# Patient Record
Sex: Female | Born: 1960 | Race: White | Hispanic: No | Smoking: Current every day smoker
Health system: Southern US, Community
[De-identification: ages and names within clinical notes are randomized; demographics above are authoritative.]

## PROBLEM LIST (undated history)

## (undated) DIAGNOSIS — F1020 Alcohol dependence, uncomplicated: Secondary | ICD-10-CM

## (undated) DIAGNOSIS — F32A Depression, unspecified: Secondary | ICD-10-CM

## (undated) DIAGNOSIS — F329 Major depressive disorder, single episode, unspecified: Secondary | ICD-10-CM

## (undated) DIAGNOSIS — F419 Anxiety disorder, unspecified: Secondary | ICD-10-CM

## (undated) DIAGNOSIS — E785 Hyperlipidemia, unspecified: Secondary | ICD-10-CM

## (undated) DIAGNOSIS — I10 Essential (primary) hypertension: Secondary | ICD-10-CM

## (undated) DIAGNOSIS — IMO0001 Reserved for inherently not codable concepts without codable children: Secondary | ICD-10-CM

## (undated) HISTORY — DX: Major depressive disorder, single episode, unspecified: F32.9

## (undated) HISTORY — DX: Depression, unspecified: F32.A

## (undated) HISTORY — DX: Reserved for inherently not codable concepts without codable children: IMO0001

## (undated) HISTORY — PX: HEMORRHOID SURGERY: SHX153

## (undated) HISTORY — DX: Essential (primary) hypertension: I10

## (undated) HISTORY — PX: APPENDECTOMY: SHX54

## (undated) HISTORY — DX: Hemochromatosis, unspecified: E83.119

## (undated) HISTORY — DX: Anxiety disorder, unspecified: F41.9

## (undated) HISTORY — DX: Alcohol dependence, uncomplicated: F10.20

---

## 1999-01-29 ENCOUNTER — Encounter: Payer: Self-pay | Admitting: Emergency Medicine

## 1999-01-29 ENCOUNTER — Inpatient Hospital Stay (HOSPITAL_COMMUNITY): Admission: EM | Admit: 1999-01-29 | Discharge: 1999-02-03 | Payer: Self-pay | Admitting: *Deleted

## 2000-09-24 ENCOUNTER — Encounter: Admission: RE | Admit: 2000-09-24 | Discharge: 2000-09-24 | Payer: Self-pay | Admitting: Obstetrics and Gynecology

## 2000-09-24 ENCOUNTER — Encounter: Payer: Self-pay | Admitting: Obstetrics and Gynecology

## 2002-07-30 ENCOUNTER — Other Ambulatory Visit: Admission: RE | Admit: 2002-07-30 | Discharge: 2002-07-30 | Payer: Self-pay | Admitting: Internal Medicine

## 2002-09-24 ENCOUNTER — Encounter: Payer: Self-pay | Admitting: Obstetrics and Gynecology

## 2002-09-24 ENCOUNTER — Encounter: Admission: RE | Admit: 2002-09-24 | Discharge: 2002-09-24 | Payer: Self-pay | Admitting: Obstetrics and Gynecology

## 2003-02-08 ENCOUNTER — Encounter (INDEPENDENT_AMBULATORY_CARE_PROVIDER_SITE_OTHER): Payer: Self-pay | Admitting: *Deleted

## 2003-02-08 ENCOUNTER — Ambulatory Visit (HOSPITAL_COMMUNITY): Admission: RE | Admit: 2003-02-08 | Discharge: 2003-02-08 | Payer: Self-pay | Admitting: Gastroenterology

## 2003-02-16 ENCOUNTER — Encounter (INDEPENDENT_AMBULATORY_CARE_PROVIDER_SITE_OTHER): Payer: Self-pay

## 2003-02-16 ENCOUNTER — Ambulatory Visit (HOSPITAL_COMMUNITY): Admission: RE | Admit: 2003-02-16 | Discharge: 2003-02-16 | Payer: Self-pay | Admitting: Surgery

## 2003-08-06 ENCOUNTER — Other Ambulatory Visit: Admission: RE | Admit: 2003-08-06 | Discharge: 2003-08-06 | Payer: Self-pay | Admitting: Obstetrics and Gynecology

## 2003-10-06 ENCOUNTER — Encounter: Admission: RE | Admit: 2003-10-06 | Discharge: 2003-10-06 | Payer: Self-pay | Admitting: Obstetrics and Gynecology

## 2003-12-02 ENCOUNTER — Encounter (INDEPENDENT_AMBULATORY_CARE_PROVIDER_SITE_OTHER): Payer: Self-pay | Admitting: Specialist

## 2003-12-02 ENCOUNTER — Ambulatory Visit (HOSPITAL_COMMUNITY): Admission: RE | Admit: 2003-12-02 | Discharge: 2003-12-02 | Payer: Self-pay | Admitting: Surgery

## 2003-12-02 ENCOUNTER — Ambulatory Visit (HOSPITAL_BASED_OUTPATIENT_CLINIC_OR_DEPARTMENT_OTHER): Admission: RE | Admit: 2003-12-02 | Discharge: 2003-12-02 | Payer: Self-pay | Admitting: Surgery

## 2004-08-08 ENCOUNTER — Other Ambulatory Visit: Admission: RE | Admit: 2004-08-08 | Discharge: 2004-08-08 | Payer: Self-pay | Admitting: Obstetrics and Gynecology

## 2004-10-11 ENCOUNTER — Ambulatory Visit (HOSPITAL_COMMUNITY): Admission: RE | Admit: 2004-10-11 | Discharge: 2004-10-11 | Payer: Self-pay | Admitting: Obstetrics and Gynecology

## 2005-03-09 ENCOUNTER — Other Ambulatory Visit: Admission: RE | Admit: 2005-03-09 | Discharge: 2005-03-09 | Payer: Self-pay | Admitting: Obstetrics and Gynecology

## 2005-08-13 ENCOUNTER — Other Ambulatory Visit: Admission: RE | Admit: 2005-08-13 | Discharge: 2005-08-13 | Payer: Self-pay | Admitting: Obstetrics and Gynecology

## 2005-10-15 ENCOUNTER — Ambulatory Visit (HOSPITAL_COMMUNITY): Admission: RE | Admit: 2005-10-15 | Discharge: 2005-10-15 | Payer: Self-pay | Admitting: Obstetrics and Gynecology

## 2006-10-17 ENCOUNTER — Ambulatory Visit (HOSPITAL_COMMUNITY): Admission: RE | Admit: 2006-10-17 | Discharge: 2006-10-17 | Payer: Self-pay | Admitting: Obstetrics and Gynecology

## 2007-06-03 ENCOUNTER — Encounter: Admission: RE | Admit: 2007-06-03 | Discharge: 2007-06-03 | Payer: Self-pay | Admitting: Obstetrics and Gynecology

## 2007-07-29 ENCOUNTER — Encounter: Admission: RE | Admit: 2007-07-29 | Discharge: 2007-07-29 | Payer: Self-pay | Admitting: Obstetrics and Gynecology

## 2008-10-01 ENCOUNTER — Ambulatory Visit (HOSPITAL_COMMUNITY): Admission: RE | Admit: 2008-10-01 | Discharge: 2008-10-01 | Payer: Self-pay | Admitting: Obstetrics and Gynecology

## 2009-01-17 LAB — HM COLONOSCOPY: HM Colonoscopy: NORMAL

## 2009-10-03 ENCOUNTER — Ambulatory Visit (HOSPITAL_COMMUNITY): Admission: RE | Admit: 2009-10-03 | Discharge: 2009-10-03 | Payer: Self-pay | Admitting: Obstetrics and Gynecology

## 2010-01-28 ENCOUNTER — Observation Stay (HOSPITAL_COMMUNITY): Admission: EM | Admit: 2010-01-28 | Discharge: 2010-01-31 | Payer: Self-pay | Admitting: Emergency Medicine

## 2010-04-14 ENCOUNTER — Ambulatory Visit: Payer: Self-pay | Admitting: Oncology

## 2010-04-20 LAB — CBC WITH DIFFERENTIAL/PLATELET
BASO%: 0.3 % (ref 0.0–2.0)
HGB: 11.1 g/dL — ABNORMAL LOW (ref 11.6–15.9)
LYMPH%: 21.2 % (ref 14.0–49.7)
MCH: 35.9 pg — ABNORMAL HIGH (ref 25.1–34.0)
MCHC: 35.5 g/dL (ref 31.5–36.0)
MONO%: 7.7 % (ref 0.0–14.0)
NEUT#: 4.1 10*3/uL (ref 1.5–6.5)
Platelets: 185 10*3/uL (ref 145–400)
WBC: 5.9 10*3/uL (ref 3.9–10.3)

## 2010-04-20 LAB — MORPHOLOGY: PLT EST: ADEQUATE

## 2010-04-20 LAB — COMPREHENSIVE METABOLIC PANEL
ALT: 25 U/L (ref 0–35)
AST: 29 U/L (ref 0–37)
Albumin: 4.7 g/dL (ref 3.5–5.2)
Alkaline Phosphatase: 60 U/L (ref 39–117)
Calcium: 9.7 mg/dL (ref 8.4–10.5)
Creatinine, Ser: 1.16 mg/dL (ref 0.40–1.20)
Sodium: 141 mEq/L (ref 135–145)
Total Bilirubin: 0.7 mg/dL (ref 0.3–1.2)
Total Protein: 7.2 g/dL (ref 6.0–8.3)

## 2010-04-23 LAB — IRON AND TIBC
TIBC: 334 ug/dL (ref 250–470)
UIBC: 164 ug/dL

## 2010-04-23 LAB — HEMOCHROMATOSIS DNA-PCR(C282Y,H63D)

## 2010-05-02 ENCOUNTER — Ambulatory Visit (HOSPITAL_COMMUNITY): Admission: RE | Admit: 2010-05-02 | Discharge: 2010-05-02 | Payer: Self-pay | Admitting: Oncology

## 2010-05-08 LAB — CBC WITH DIFFERENTIAL/PLATELET
BASO%: 0.4 % (ref 0.0–2.0)
EOS%: 1.2 % (ref 0.0–7.0)
Eosinophils Absolute: 0.1 10*3/uL (ref 0.0–0.5)
MCH: 35.8 pg — ABNORMAL HIGH (ref 25.1–34.0)
MCHC: 35.1 g/dL (ref 31.5–36.0)
MCV: 102 fL — ABNORMAL HIGH (ref 79.5–101.0)
MONO%: 6.1 % (ref 0.0–14.0)
Platelets: 234 10*3/uL (ref 145–400)
RBC: 3.12 10*6/uL — ABNORMAL LOW (ref 3.70–5.45)

## 2010-05-09 LAB — HEPATITIS C ANTIBODY: HCV Ab: NEGATIVE

## 2010-05-09 LAB — HEPATITIS B SURFACE ANTIBODY,QUALITATIVE: Hep B S Ab: NEGATIVE

## 2010-07-13 ENCOUNTER — Ambulatory Visit: Payer: Self-pay | Admitting: Oncology

## 2010-07-17 LAB — CBC & DIFF AND RETIC
BASO%: 0.1 % (ref 0.0–2.0)
Basophils Absolute: 0 10*3/uL (ref 0.0–0.1)
EOS%: 1.1 % (ref 0.0–7.0)
Eosinophils Absolute: 0.1 10*3/uL (ref 0.0–0.5)
HCT: 34.8 % (ref 34.8–46.6)
HGB: 12 g/dL (ref 11.6–15.9)
Immature Retic Fract: 21.5 % — ABNORMAL HIGH (ref 0.00–10.70)
LYMPH%: 25.6 % (ref 14.0–49.7)
MCH: 35.4 pg — ABNORMAL HIGH (ref 25.1–34.0)
MCHC: 34.5 g/dL (ref 31.5–36.0)
MCV: 102.7 fL — ABNORMAL HIGH (ref 79.5–101.0)
MONO#: 0.6 10*3/uL (ref 0.1–0.9)
MONO%: 6.9 % (ref 0.0–14.0)
NEUT#: 5.9 10*3/uL (ref 1.5–6.5)
NEUT%: 66.3 % (ref 38.4–76.8)
Platelets: 233 10*3/uL (ref 145–400)
RBC: 3.39 10*6/uL — ABNORMAL LOW (ref 3.70–5.45)
RDW: 12.5 % (ref 11.2–14.5)
Retic %: 2.43 % — ABNORMAL HIGH (ref 0.50–1.50)
Retic Ct Abs: 82.38 10*3/uL — ABNORMAL HIGH (ref 18.30–72.70)
WBC: 8.9 10*3/uL (ref 3.9–10.3)
lymph#: 2.3 10*3/uL (ref 0.9–3.3)
nRBC: 0 % (ref 0–0)

## 2010-07-17 LAB — IRON AND TIBC
%SAT: 69 % — ABNORMAL HIGH (ref 20–55)
Iron: 233 ug/dL — ABNORMAL HIGH (ref 42–145)
TIBC: 337 ug/dL (ref 250–470)
UIBC: 104 ug/dL

## 2010-10-04 ENCOUNTER — Ambulatory Visit (HOSPITAL_COMMUNITY)
Admission: RE | Admit: 2010-10-04 | Discharge: 2010-10-04 | Payer: Self-pay | Source: Home / Self Care | Attending: Obstetrics and Gynecology | Admitting: Obstetrics and Gynecology

## 2010-10-12 ENCOUNTER — Ambulatory Visit: Payer: Self-pay | Admitting: Oncology

## 2010-10-16 LAB — CBC WITH DIFFERENTIAL/PLATELET
Basophils Absolute: 0 10*3/uL (ref 0.0–0.1)
EOS%: 0.6 % (ref 0.0–7.0)
Eosinophils Absolute: 0.1 10*3/uL (ref 0.0–0.5)
HGB: 12.3 g/dL (ref 11.6–15.9)
LYMPH%: 22.8 % (ref 14.0–49.7)
MCH: 35.6 pg — ABNORMAL HIGH (ref 25.1–34.0)
MCHC: 34.5 g/dL (ref 31.5–36.0)
MONO#: 0.6 10*3/uL (ref 0.1–0.9)
MONO%: 6.4 % (ref 0.0–14.0)
NEUT#: 6.5 10*3/uL (ref 1.5–6.5)
Platelets: 231 10*3/uL (ref 145–400)
WBC: 9.4 10*3/uL (ref 3.9–10.3)
lymph#: 2.1 10*3/uL (ref 0.9–3.3)

## 2010-10-16 LAB — IRON AND TIBC
Iron: 311 ug/dL — ABNORMAL HIGH (ref 42–145)
UIBC: <55 ug/dL

## 2010-10-16 LAB — FERRITIN: Ferritin: 851 ng/mL — ABNORMAL HIGH (ref 10–291)

## 2010-12-04 LAB — CARDIAC PANEL(CRET KIN+CKTOT+MB+TROPI)
CK, MB: 3.7 ng/mL (ref 0.3–4.0)
CK, MB: 4.1 ng/mL — ABNORMAL HIGH (ref 0.3–4.0)

## 2010-12-04 LAB — BASIC METABOLIC PANEL
BUN: 23 mg/dL (ref 6–23)
BUN: 5 mg/dL — ABNORMAL LOW (ref 6–23)
BUN: 9 mg/dL (ref 6–23)
Calcium: 7.8 mg/dL — ABNORMAL LOW (ref 8.4–10.5)
Chloride: 110 mEq/L (ref 96–112)
Chloride: 111 mEq/L (ref 96–112)
Creatinine, Ser: 0.93 mg/dL (ref 0.4–1.2)
Creatinine, Ser: 1.71 mg/dL — ABNORMAL HIGH (ref 0.4–1.2)
GFR calc non Af Amer: 32 mL/min — ABNORMAL LOW (ref >60)
GFR calc non Af Amer: 59 mL/min — ABNORMAL LOW (ref >60)
Glucose, Bld: 101 mg/dL — ABNORMAL HIGH (ref 70–99)
Glucose, Bld: 106 mg/dL — ABNORMAL HIGH (ref 70–99)
Glucose, Bld: 107 mg/dL — ABNORMAL HIGH (ref 70–99)
Potassium: 3.4 mEq/L — ABNORMAL LOW (ref 3.5–5.1)
Potassium: 3.6 mEq/L (ref 3.5–5.1)
Sodium: 136 mEq/L (ref 135–145)
Sodium: 141 mEq/L (ref 135–145)

## 2010-12-04 LAB — CBC
HCT: 26.5 % — ABNORMAL LOW (ref 36.0–46.0)
HCT: 26.8 % — ABNORMAL LOW (ref 36.0–46.0)
HCT: 26.9 % — ABNORMAL LOW (ref 36.0–46.0)
Hemoglobin: 9.3 g/dL — ABNORMAL LOW (ref 12.0–15.0)
Hemoglobin: 9.3 g/dL — ABNORMAL LOW (ref 12.0–15.0)
MCHC: 34.7 g/dL (ref 30.0–36.0)
MCV: 103.9 fL — ABNORMAL HIGH (ref 78.0–100.0)
MCV: 105.1 fL — ABNORMAL HIGH (ref 78.0–100.0)
MCV: 106.1 fL — ABNORMAL HIGH (ref 78.0–100.0)
Platelets: 150 10*3/uL (ref 150–400)
Platelets: 158 10*3/uL (ref 150–400)
RBC: 2.58 MIL/uL — ABNORMAL LOW (ref 3.87–5.11)
RDW: 12.6 % (ref 11.5–15.5)
RDW: 13.3 % (ref 11.5–15.5)
RDW: 13.3 % (ref 11.5–15.5)
WBC: 6.7 10*3/uL (ref 4.0–10.5)

## 2010-12-04 LAB — DIFFERENTIAL
Basophils Absolute: 0 10*3/uL (ref 0.0–0.1)
Basophils Relative: 0 % (ref 0–1)
Eosinophils Absolute: 0.1 10*3/uL (ref 0.0–0.7)
Eosinophils Relative: 1 % (ref 0–5)
Monocytes Absolute: 0.6 10*3/uL (ref 0.1–1.0)
Monocytes Relative: 8 % (ref 3–12)
Neutro Abs: 4.1 10*3/uL (ref 1.7–7.7)

## 2010-12-05 LAB — URINALYSIS, ROUTINE W REFLEX MICROSCOPIC
Bilirubin Urine: NEGATIVE
Ketones, ur: NEGATIVE mg/dL
Nitrite: NEGATIVE
Protein, ur: NEGATIVE mg/dL
Urobilinogen, UA: 0.2 mg/dL (ref 0.0–1.0)

## 2010-12-05 LAB — LIPID PANEL
Cholesterol: 124 mg/dL (ref 0–200)
HDL: 59 mg/dL (ref >39)

## 2010-12-05 LAB — COMPREHENSIVE METABOLIC PANEL
Albumin: 3.9 g/dL (ref 3.5–5.2)
Alkaline Phosphatase: 64 U/L (ref 39–117)
BUN: 26 mg/dL — ABNORMAL HIGH (ref 6–23)
GFR calc Af Amer: 25 mL/min — ABNORMAL LOW (ref >60)
Potassium: 3.9 mEq/L (ref 3.5–5.1)
Sodium: 137 mEq/L (ref 135–145)
Total Protein: 6.3 g/dL (ref 6.0–8.3)

## 2010-12-05 LAB — CBC
HCT: 29.9 % — ABNORMAL LOW (ref 36.0–46.0)
Platelets: 186 10*3/uL (ref 150–400)
RDW: 13 % (ref 11.5–15.5)

## 2010-12-05 LAB — CK TOTAL AND CKMB (NOT AT ARMC)
CK, MB: 2.5 ng/mL (ref 0.3–4.0)
Relative Index: 1.7 (ref 0.0–2.5)

## 2010-12-05 LAB — TSH: TSH: 1.414 u[IU]/mL (ref 0.350–4.500)

## 2010-12-05 LAB — POCT CARDIAC MARKERS
CKMB, poc: <1 ng/mL — ABNORMAL LOW (ref 1.0–8.0)
Myoglobin, poc: >500 ng/mL (ref 12–200)
Troponin i, poc: <0.05 ng/mL (ref 0.00–0.09)

## 2010-12-05 LAB — URINE CULTURE: Colony Count: 45000

## 2010-12-05 LAB — DIFFERENTIAL
Basophils Relative: 0 % (ref 0–1)
Monocytes Absolute: 0.7 10*3/uL (ref 0.1–1.0)
Monocytes Relative: 5 % (ref 3–12)
Neutro Abs: 10.6 10*3/uL — ABNORMAL HIGH (ref 1.7–7.7)

## 2010-12-05 LAB — LACTIC ACID, PLASMA: Lactic Acid, Venous: 2.8 mmol/L — ABNORMAL HIGH (ref 0.5–2.2)

## 2011-01-15 ENCOUNTER — Encounter (HOSPITAL_BASED_OUTPATIENT_CLINIC_OR_DEPARTMENT_OTHER): Payer: 59 | Admitting: Oncology

## 2011-01-15 ENCOUNTER — Other Ambulatory Visit: Payer: Self-pay | Admitting: Oncology

## 2011-01-15 DIAGNOSIS — R799 Abnormal finding of blood chemistry, unspecified: Secondary | ICD-10-CM

## 2011-01-15 DIAGNOSIS — D649 Anemia, unspecified: Secondary | ICD-10-CM

## 2011-01-15 DIAGNOSIS — M069 Rheumatoid arthritis, unspecified: Secondary | ICD-10-CM

## 2011-01-15 LAB — COMPREHENSIVE METABOLIC PANEL
ALT: 22 U/L (ref 0–35)
CO2: 24 mEq/L (ref 19–32)
Calcium: 10.6 mg/dL — ABNORMAL HIGH (ref 8.4–10.5)
Chloride: 102 mEq/L (ref 96–112)
Sodium: 140 mEq/L (ref 135–145)
Total Bilirubin: 0.5 mg/dL (ref 0.3–1.2)
Total Protein: 7 g/dL (ref 6.0–8.3)

## 2011-01-15 LAB — CBC WITH DIFFERENTIAL/PLATELET
BASO%: 0.3 % (ref 0.0–2.0)
MCHC: 34.4 g/dL (ref 31.5–36.0)
MONO#: 0.4 10*3/uL (ref 0.1–0.9)
RBC: 3.61 10*6/uL — ABNORMAL LOW (ref 3.70–5.45)
WBC: 5.9 10*3/uL (ref 3.9–10.3)
lymph#: 1.5 10*3/uL (ref 0.9–3.3)

## 2011-01-15 LAB — FERRITIN: Ferritin: 517 ng/mL — ABNORMAL HIGH (ref 10–291)

## 2011-01-15 LAB — IRON AND TIBC: Iron: 306 ug/dL — ABNORMAL HIGH (ref 42–145)

## 2011-01-19 ENCOUNTER — Encounter (HOSPITAL_BASED_OUTPATIENT_CLINIC_OR_DEPARTMENT_OTHER): Payer: 59 | Admitting: Oncology

## 2011-02-02 NOTE — Op Note (Signed)
NAMEALEKSA, COLLINSWORTH                           ACCOUNT NO.:  [id number]   MEDICAL RECORD NO.:  [id number]                   PATIENT TYPE:  AMB   LOCATION:  ENDO                                 FACILITY:  [facility]   PHYSICIAN:  Anselmo Rod, M.D.               DATE OF BIRTH:  04-Dec-1960   DATE OF PROCEDURE:  02/08/2003  DATE OF DISCHARGE:                                 OPERATIVE REPORT   PROCEDURE PERFORMED:  Colonoscopy with snare polypectomy x1.   ENDOSCOPIST:  Charna Elizabeth, M.D.   INSTRUMENT USED:  Olympus video colonoscope.   INDICATIONS FOR PROCEDURE:  Bloody discharge with weight loss in a 50-year-  old white female.  The patient is status post right colectomy with  __________ after a ruptured appendix was removed in 2000 by Dr. Chelsea Aus.   PREPROCEDURE PREPARATION:  Informed consent was procured from the patient.  The patient was fasted for eight hours prior to the procedure and prepped  with a bottle of magnesium citrate and a gallon of GoLYTELY the night prior  to the procedure.   PREPROCEDURE PHYSICAL:  The patient had stable vital signs.  Neck supple.  Chest clear to auscultation.  S1 and S2 regular.  Abdomen soft with normal  bowel sounds.   DESCRIPTION OF PROCEDURE:  The patient was placed in left lateral decubitus  position and sedated with 50 mg of Demerol and 5 mg of Versed intravenously.  Once the patient was adequately sedated and maintained on low flow oxygen  and continuous cardiac monitoring, the Olympus video colonoscope was  advanced from the rectum to the neo cecum/anastomosis without difficulty.  Prominent bleeding internal hemorrhoids were noticed.  There was a large  amount of fresh bleeding noticed on retroflexion in the rectum.  A small  polyp was snared from the rectum at 5 cm.  The rest of the colonic mucosa up  to the anastomosis appeared healthy and without lesions.   IMPRESSION:  1. Essentially a normal colonoscopy except for a small  sessile polyp snared     from the rectum.  2. Prominent bleeding internal hemorrhoids seen on retroflexion.   RECOMMENDATIONS:  1. A surgical appointment has been set up for the patient with Abigail Miyamoto, M.D. on Feb 10, 2003 for possible hemorrhoidectomy as the     patient has not responded to conventional treatment with regard to her     hemorrhoids.  2.     Await pathology results.  3. Outpatient follow-up in the future after the surgical evaluation.  4. Repeat colorectal cancer screening depending on pathology results.  Anselmo Rod, M.D.    JNM/MEDQ  D:  02/08/2003  T:  02/09/2003  Job:  [id number]   cc:   Tammy R. Collins Scotland, M.D.  [address]  [place]  [place] [address]  Fax: [phone]   Abigail Miyamoto, M.D.  [address]  [place]  [place] [address]  Fax: [phone]

## 2011-02-02 NOTE — Op Note (Signed)
Joyce Hoffman, Joyce Hoffman                           ACCOUNT NO.:  [id number]   MEDICAL RECORD NO.:  [id number]                   PATIENT TYPE:  AMB   LOCATION:  DAY                                  FACILITY:  [facility]   PHYSICIAN:  Abigail Miyamoto, M.D.              DATE OF BIRTH:  03-10-1961   DATE OF PROCEDURE:  02/16/2003  DATE OF DISCHARGE:                                 OPERATIVE REPORT   PREOPERATIVE DIAGNOSIS:  Bleeding hemorrhoids.   POSTOPERATIVE DIAGNOSIS:  Bleeding hemorrhoids.   PROCEDURE:  Examination under anesthesia and hemorrhoidectomy.   SURGEON:  Abigail Miyamoto, M.D.   ANESTHESIA:  General endotracheal anesthesia.   ESTIMATED BLOOD LOSS:  Minimal.   INDICATIONS FOR PROCEDURE:  Joyce Hoffman is a 50- year-old female who has  had bright red blood per rectum.  She has had extensive workup by Anselmo Rod, M.D., has had a complete colonoscopy which was unremarkable except for  the small internal hemorrhoids.  Therefore decision has been made to proceed  to the operating room for hemorrhoidectomy after a long discussion with the  patient.   DESCRIPTION OF PROCEDURE:  The patient was brought to the operating room and  identified as Joyce Hoffman.  She was placed supine on the operating room  and anesthesia was induced.  The patient was then placed in the prone  position.  The Hill-Ferguson retractor was then inserted into the anal canal  after the anus and perineum were prepped and draped.  The patient was found  to have two small hemorrhoidal columns which bled easily on the left lateral  side.  The rest of the anal canal appeared normal.  At this point a chromic  stitch was placed proximal in the anal canal to the internal hemorrhoidal  column which was grasped with an Allis clamp.  The column was then excised  circumferentially with the electrocautery.  The mucosa was then  reapproximated with the running interlocked 2-0 chromic suture.  The second  column  was likewise grasped with an Allis clamp.  A separate chromic suture  was placed proximal to this.  Again this hemorrhoid was excised completely  with electrocautery as well.  Again the defect was closed with a running  interlocked chromic suture.  The anal canal was again examined and  hemostasis felt to be achieved.  Again, no other intraanal pathology is  identified.  At this point the perianal area was  anesthetized with 0.25% Marcaine with epinephrine.  A piece of Gelfoam was  then inserted into the anal canal.  The patient tolerated the procedure  well.  She was then placed back into the supine position, extubated in the  operating room and taken in stable condition to the recovery room.  Abigail Miyamoto, M.D.    DB/MEDQ  D:  02/16/2003  T:  02/16/2003  Job:  [id number]   cc:   Anselmo Rod, M.D.  [address]  [address]  [place]  [place] [address]  Fax: [phone]

## 2011-02-02 NOTE — Op Note (Signed)
NAMEBLANCH, [name]                           ACCOUNT NO.:  [id number]   MEDICAL RECORD NO.:  [id number]                   PATIENT TYPE:  AMB   LOCATION:  DSC                                  FACILITY:  [facility]   PHYSICIAN:  Abigail Miyamoto, M.D.              DATE OF BIRTH:  1961-08-23   DATE OF PROCEDURE:  12/02/2003  DATE OF DISCHARGE:                                 OPERATIVE REPORT   PREOPERATIVE DIAGNOSIS:  Anal skin tag.   POSTOPERATIVE DIAGNOSIS:  Anal skin tag.   PROCEDURE:  Excision of anal skin tag.   SURGEON:  Abigail Miyamoto, M.D.   ANESTHESIA:  1% lidocaine, 1/4% Marcaine using monitored anesthesia care.   ESTIMATED BLOOD LOSS:  Minimal.   INDICATIONS FOR PROCEDURE:  The patient is a 50 year old female who had a  previous hemorrhoidectomy.  She now has a large skin tag on the left  perianal area which is causing her to have some discomfort and hygiene  problems.  Therefore, the decision was made to excise this area.   DESCRIPTION OF PROCEDURE:  The patient was brought to the operating room and  identified as Joyce Hoffman.  She was placed upon the operating room table  and then anesthesia was induced.  The patient was then placed in the  lithotomy position.  Her perianal area was then prepped and draped in the  usual sterile fashion.  The perianal skin around the anal skin tag was then  anesthetized with 1% lidocaine with epinephrine.  The skin tag was then  grasped with an Allis clamp and completely excised with electrocautery.  The  wound was then anesthetized further with 1/4% Marcaine with epinephrine.  Hemostasis was achieved with the cautery.  Neosporin was then placed to the  wound.  The patient tolerated the procedure well.  Gauze was then placed  over the wound.  She was taken in stable condition from the operating room  to the recovery room.                                               Abigail Miyamoto, M.D.    DB/MEDQ  D:  12/02/2003  T:   12/05/2003  Job:  [id number]

## 2011-04-16 ENCOUNTER — Encounter (HOSPITAL_BASED_OUTPATIENT_CLINIC_OR_DEPARTMENT_OTHER): Payer: 59 | Admitting: Oncology

## 2011-04-16 ENCOUNTER — Other Ambulatory Visit: Payer: Self-pay | Admitting: Oncology

## 2011-04-16 DIAGNOSIS — D649 Anemia, unspecified: Secondary | ICD-10-CM

## 2011-04-16 LAB — CBC WITH DIFFERENTIAL/PLATELET
BASO%: 0.8 % (ref 0.0–2.0)
EOS%: 3.2 % (ref 0.0–7.0)
HGB: 13.4 g/dL (ref 11.6–15.9)
MCH: 35.8 pg — ABNORMAL HIGH (ref 25.1–34.0)
MCHC: 35.4 g/dL (ref 31.5–36.0)
MCV: 101.3 fL — ABNORMAL HIGH (ref 79.5–101.0)
MONO%: 6.7 % (ref 0.0–14.0)
RBC: 3.73 10*6/uL (ref 3.70–5.45)
RDW: 12.5 % (ref 11.2–14.5)
lymph#: 1.4 10*3/uL (ref 0.9–3.3)

## 2011-04-16 LAB — IRON AND TIBC
Iron: 106 ug/dL (ref 42–145)
TIBC: 312 ug/dL (ref 250–470)
UIBC: 206 ug/dL

## 2011-04-20 ENCOUNTER — Encounter (HOSPITAL_BASED_OUTPATIENT_CLINIC_OR_DEPARTMENT_OTHER): Payer: 59 | Admitting: Oncology

## 2011-07-16 ENCOUNTER — Telehealth: Payer: Self-pay | Admitting: Oncology

## 2011-07-16 ENCOUNTER — Other Ambulatory Visit: Payer: Self-pay | Admitting: Oncology

## 2011-07-16 ENCOUNTER — Ambulatory Visit (HOSPITAL_BASED_OUTPATIENT_CLINIC_OR_DEPARTMENT_OTHER): Payer: 59 | Admitting: Oncology

## 2011-07-16 DIAGNOSIS — D649 Anemia, unspecified: Secondary | ICD-10-CM

## 2011-07-16 DIAGNOSIS — M069 Rheumatoid arthritis, unspecified: Secondary | ICD-10-CM

## 2011-07-16 DIAGNOSIS — I10 Essential (primary) hypertension: Secondary | ICD-10-CM

## 2011-07-16 DIAGNOSIS — R799 Abnormal finding of blood chemistry, unspecified: Secondary | ICD-10-CM

## 2011-07-16 LAB — CBC WITH DIFFERENTIAL/PLATELET
BASO%: 0.3 % (ref 0.0–2.0)
HCT: 40 % (ref 34.8–46.6)
LYMPH%: 25.2 % (ref 14.0–49.7)
MCH: 34 pg (ref 25.1–34.0)
MCHC: 35.1 g/dL (ref 31.5–36.0)
MCV: 96.9 fL (ref 79.5–101.0)
MONO#: 0.7 10*3/uL (ref 0.1–0.9)
MONO%: 7.5 % (ref 0.0–14.0)
NEUT%: 65.9 % (ref 38.4–76.8)
Platelets: 194 10*3/uL (ref 145–400)
RBC: 4.13 10*6/uL (ref 3.70–5.45)
WBC: 8.7 10*3/uL (ref 3.9–10.3)

## 2011-07-16 LAB — COMPREHENSIVE METABOLIC PANEL
Albumin: 4.7 g/dL (ref 3.5–5.2)
BUN: 22 mg/dL (ref 6–23)
CO2: 22 mEq/L (ref 19–32)
Calcium: 9.8 mg/dL (ref 8.4–10.5)
Chloride: 104 mEq/L (ref 96–112)
Glucose, Bld: 103 mg/dL — ABNORMAL HIGH (ref 70–99)
Potassium: 3.9 mEq/L (ref 3.5–5.3)
Sodium: 139 mEq/L (ref 135–145)
Total Protein: 6.7 g/dL (ref 6.0–8.3)

## 2011-07-16 LAB — IRON AND TIBC: UIBC: 167 ug/dL (ref 125–400)

## 2011-07-16 LAB — FERRITIN: Ferritin: 252 ng/mL (ref 10–291)

## 2011-07-16 NOTE — Telephone Encounter (Signed)
gv pt appt schedule for jan thru oct 2013.;

## 2011-07-20 ENCOUNTER — Encounter (HOSPITAL_BASED_OUTPATIENT_CLINIC_OR_DEPARTMENT_OTHER): Payer: 59 | Admitting: Oncology

## 2011-08-27 ENCOUNTER — Other Ambulatory Visit (HOSPITAL_COMMUNITY): Payer: Self-pay | Admitting: Obstetrics and Gynecology

## 2011-08-27 DIAGNOSIS — Z1231 Encounter for screening mammogram for malignant neoplasm of breast: Secondary | ICD-10-CM

## 2011-10-08 ENCOUNTER — Ambulatory Visit (HOSPITAL_COMMUNITY): Payer: 59

## 2011-10-15 ENCOUNTER — Ambulatory Visit (HOSPITAL_BASED_OUTPATIENT_CLINIC_OR_DEPARTMENT_OTHER): Payer: 59 | Admitting: Lab

## 2011-10-15 LAB — CBC WITH DIFFERENTIAL/PLATELET
Basophils Absolute: 0 10*3/uL (ref 0.0–0.1)
Eosinophils Absolute: 0.1 10*3/uL (ref 0.0–0.5)
HGB: 13.8 g/dL (ref 11.6–15.9)
MONO%: 5.5 % (ref 0.0–14.0)
NEUT#: 5.6 10*3/uL (ref 1.5–6.5)
RBC: 4.07 10*6/uL (ref 3.70–5.45)
RDW: 13.2 % (ref 11.2–14.5)
WBC: 8.4 10*3/uL (ref 3.9–10.3)
lymph#: 2.2 10*3/uL (ref 0.9–3.3)

## 2011-10-15 LAB — FERRITIN: Ferritin: 101 ng/mL (ref 10–291)

## 2011-10-16 NOTE — Progress Notes (Signed)
Called patient and informed patient that she does not need phlebotomy, per Dr. Gaylyn Rong.

## 2011-11-12 ENCOUNTER — Ambulatory Visit (HOSPITAL_COMMUNITY)
Admission: RE | Admit: 2011-11-12 | Discharge: 2011-11-12 | Disposition: A | Discharge status: Home / Self Care | Payer: 59 | Source: Ambulatory Visit | Attending: Obstetrics and Gynecology | Admitting: Obstetrics and Gynecology

## 2011-11-12 DIAGNOSIS — Z1231 Encounter for screening mammogram for malignant neoplasm of breast: Secondary | ICD-10-CM | POA: Insufficient documentation

## 2012-01-14 ENCOUNTER — Other Ambulatory Visit: Payer: 59 | Admitting: Lab

## 2012-04-14 ENCOUNTER — Other Ambulatory Visit (HOSPITAL_BASED_OUTPATIENT_CLINIC_OR_DEPARTMENT_OTHER): Payer: 59 | Admitting: Lab

## 2012-04-14 DIAGNOSIS — D649 Anemia, unspecified: Secondary | ICD-10-CM

## 2012-04-14 LAB — CBC WITH DIFFERENTIAL/PLATELET
Eosinophils Absolute: 0.1 10*3/uL (ref 0.0–0.5)
HCT: 39.3 % (ref 34.8–46.6)
LYMPH%: 30.5 % (ref 14.0–49.7)
MCHC: 34.4 g/dL (ref 31.5–36.0)
MCV: 100.5 fL (ref 79.5–101.0)
MONO#: 0.5 10*3/uL (ref 0.1–0.9)
MONO%: 7.6 % (ref 0.0–14.0)
NEUT%: 60 % (ref 38.4–76.8)
Platelets: 189 10*3/uL (ref 145–400)
RBC: 3.91 10*6/uL (ref 3.70–5.45)

## 2012-04-14 LAB — FERRITIN: Ferritin: 212 ng/mL (ref 10–291)

## 2012-04-15 ENCOUNTER — Telehealth: Payer: Self-pay

## 2012-04-15 ENCOUNTER — Encounter: Payer: Self-pay | Admitting: Oncology

## 2012-04-15 ENCOUNTER — Other Ambulatory Visit: Payer: Self-pay | Admitting: Oncology

## 2012-04-15 HISTORY — DX: Hemochromatosis, unspecified: E83.119

## 2012-04-15 NOTE — Telephone Encounter (Signed)
Message copied by Kallie Locks on Tue Apr 15, 2012  4:19 PM ------      Message from: Jethro Bolus T      Created: Tue Apr 15, 2012  8:29 AM       Please call pt and inform her that a scheduler will contact her with date/time for phlebotomy (for her hemochromatosis) within 10 days.  Her Hgb is >50 and her Hgb is good enough for phlebotomy.  I've placed a POF.  Thanks.

## 2012-04-17 ENCOUNTER — Telehealth: Payer: Self-pay | Admitting: *Deleted

## 2012-04-17 NOTE — Telephone Encounter (Signed)
Per orders from 04-15-2012 scheduled patient for 04-29-2012 lab and Therapeutic Phlebotomy

## 2012-04-17 NOTE — Telephone Encounter (Signed)
Per staff message and POF I have scheduled appt.  JMW  

## 2012-04-29 ENCOUNTER — Other Ambulatory Visit: Payer: 59 | Admitting: Lab

## 2012-04-29 ENCOUNTER — Telehealth: Payer: Self-pay | Admitting: *Deleted

## 2012-04-29 NOTE — Telephone Encounter (Signed)
Left message on voicemail for pt to call office to reschedule missed appt today.

## 2012-07-14 ENCOUNTER — Ambulatory Visit (HOSPITAL_BASED_OUTPATIENT_CLINIC_OR_DEPARTMENT_OTHER): Payer: 59 | Admitting: Lab

## 2012-07-14 ENCOUNTER — Telehealth: Payer: Self-pay | Admitting: Oncology

## 2012-07-14 ENCOUNTER — Ambulatory Visit (HOSPITAL_BASED_OUTPATIENT_CLINIC_OR_DEPARTMENT_OTHER): Payer: 59 | Admitting: Oncology

## 2012-07-14 DIAGNOSIS — R799 Abnormal finding of blood chemistry, unspecified: Secondary | ICD-10-CM

## 2012-07-14 LAB — COMPREHENSIVE METABOLIC PANEL (CC13)
ALT: 19 U/L (ref 0–55)
AST: 21 U/L (ref 5–34)
Alkaline Phosphatase: 64 U/L (ref 40–150)
BUN: 24 mg/dL (ref 7.0–26.0)
Calcium: 9.9 mg/dL (ref 8.4–10.4)
Creatinine: 1 mg/dL (ref 0.6–1.1)
Sodium: 139 mEq/L (ref 136–145)
Total Bilirubin: 0.41 mg/dL (ref 0.20–1.20)

## 2012-07-14 LAB — CBC WITH DIFFERENTIAL/PLATELET
BASO%: 0.3 % (ref 0.0–2.0)
Basophils Absolute: 0 10*3/uL (ref 0.0–0.1)
EOS%: 1.4 % (ref 0.0–7.0)
HCT: 39.1 % (ref 34.8–46.6)
HGB: 13.6 g/dL (ref 11.6–15.9)
LYMPH%: 27 % (ref 14.0–49.7)
MCH: 36.3 pg — ABNORMAL HIGH (ref 25.1–34.0)
MCHC: 34.8 g/dL (ref 31.5–36.0)
MCV: 104.1 fL — ABNORMAL HIGH (ref 79.5–101.0)
MONO%: 8 % (ref 0.0–14.0)
NEUT%: 63.3 % (ref 38.4–76.8)
lymph#: 1.8 10*3/uL (ref 0.9–3.3)

## 2012-07-14 NOTE — Patient Instructions (Addendum)
1.  Diagnosis:  Hemochromatosis. 2.  Treatment:  Phlebotomy every 4 months for Hgb >45.  3.  Follow up:  In about 1 year.

## 2012-07-14 NOTE — Telephone Encounter (Signed)
gv pt appt schedule for January - April - July - October 2014. Pt sent back to lab.

## 2012-07-15 NOTE — Progress Notes (Signed)
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   CURRENT THERAPY:  Phlebotomy for Hct >45 or Ferritin >50.   INTERVAL HISTORY: Joyce Hoffman 51 y.o. female returns for regular follow up.  She reports feeling well.  She denied SOB, chest pain, PND, orthopnea, jaundice, abdominal pain, abdominal swelling, ascites, pedal edema, bleeding symptoms, joint pain/swelling, polyphagia/polydypsia/polyuria.  She still works full time without problem.   Past Medical History  Diagnosis Date  . Hemochromatosis 04/15/2012    No past surgical history on file.  Current Outpatient Prescriptions  Medication Sig Dispense Refill  . hydrochlorothiazide (HYDRODIURIL) 12.5 MG tablet Take 12.5 mg by mouth daily. 1/2 tablet daily      . sertraline (ZOLOFT) 25 MG tablet Take 25 mg by mouth daily.        ALLERGIES:  is allergic to mobic.  REVIEW OF SYSTEMS:  The rest of the 14-point review of system was negative.   Filed Vitals:   07/14/12 1501  BP: 136/84  Pulse: 86  Temp: 98.2 F (36.8 C)  Resp: 20   Wt Readings from Last 3 Encounters:  07/14/12 171 lb 14.4 oz (77.973 kg)   ECOG Performance status: 0  PHYSICAL EXAMINATION:   General:  well-nourished woman, in no acute distress.  Eyes:  no scleral icterus.  ENT:  There were no oropharyngeal lesions.  Neck was without thyromegaly.  Lymphatics:  Negative cervical, supraclavicular or axillary adenopathy.  Respiratory: lungs were clear bilaterally without wheezing or crackles.  Cardiovascular:  Regular rate and rhythm, S1/S2, without murmur, rub or gallop.  There was no pedal edema.  GI:  abdomen was soft, flat, nontender, nondistended, without organomegaly.  Muscoloskeletal:  no spinal tenderness of palpation of vertebral spine.  Skin exam was without echymosis, petichae.  Neuro exam was nonfocal.  Patient was able to get on and  off exam table without assistance.  Gait was normal.  Patient was alerted and oriented.  Attention was good.   Language was appropriate.  Mood was normal without depression.  Speech was not pressured.  Thought content was not tangential.     LABORATORY/RADIOLOGY DATA:  Lab Results  Component Value Date   WBC 6.6 07/14/2012   HGB 13.6 07/14/2012   HCT 39.1 07/14/2012   PLT 176 07/14/2012   GLUCOSE 95 07/14/2012   CHOL  Value: 124        ATP III CLASSIFICATION:  <200     mg/dL   Desirable  657-846  mg/dL   Borderline High  >=962    mg/dL   High        9/52/8413   TRIG 327* 01/28/2010   HDL 59 01/28/2010   LDLCALC  Value: 0        Total Cholesterol/HDL:CHD Risk Coronary Heart Disease Risk Table                     Men   Women  1/2 Average Risk   3.4   3.3  Average Risk       5.0   4.4  2 X Average Risk   9.6   7.1  3 X Average Risk  23.4   11.0        Use the calculated Patient Ratio above and the CHD Risk Table to determine the patient's CHD Risk.        ATP III CLASSIFICATION (  LDL):  <100     mg/dL   Optimal  161-096  mg/dL   Near or Above                    Optimal  130-159  mg/dL   Borderline  045-409  mg/dL   High  >811     mg/dL   Very High 05/31/7828   ALKPHOS 64 07/14/2012   ALT 19 07/14/2012   AST 21 07/14/2012   NA 139 07/14/2012   K 4.2 07/14/2012   CL 104 07/14/2012   CREATININE 1.0 07/14/2012   BUN 24.0 07/14/2012   CO2 24 07/14/2012     ASSESSMENT AND PLAN:  1.  Hemochromatosis:   - As her ferritin is >50; and she is not anemic, I again recommended 1 unit phlebotomy within the next few weeks.  She had vasovagal response in the past.  She therefore will need Ativan 0.5mg  PO x one; normal saline along with one unit of phlebotomy each time.   2.  Slightly elevated AFP:  Within range for her past AFP.  Past US abdomen was negative.  In the future, if her AFP increases from her normal range, I will consider repeating US abdomen.  3.  Follow up:  Lab only in about 4 and 8  months.  Return visit in about 1 year.     The length of time of the face-to-face encounter was 15  minutes. More than 50% of time was spent counseling and coordination of care.

## 2012-07-16 ENCOUNTER — Telehealth: Payer: Self-pay | Admitting: *Deleted

## 2012-07-16 ENCOUNTER — Telehealth: Payer: Self-pay | Admitting: Oncology

## 2012-07-16 NOTE — Telephone Encounter (Signed)
Left Message - lvm for pt regarding NOV appt.....mailed NOV appt schedule to pt.   By Collier Salina

## 2012-07-16 NOTE — Telephone Encounter (Signed)
Per staff message and POF I have scheduled appt.  JMW  

## 2012-08-04 ENCOUNTER — Other Ambulatory Visit: Payer: Self-pay | Admitting: Oncology

## 2012-08-08 ENCOUNTER — Ambulatory Visit (HOSPITAL_BASED_OUTPATIENT_CLINIC_OR_DEPARTMENT_OTHER): Payer: 59

## 2012-08-08 ENCOUNTER — Other Ambulatory Visit: Payer: Self-pay | Admitting: *Deleted

## 2012-08-08 MED ORDER — LORAZEPAM 1 MG PO TABS
0.5000 mg | ORAL_TABLET | Freq: Once | ORAL | Status: AC
  Administered 2012-08-08: 0.5 mg via ORAL

## 2012-08-08 MED ORDER — SODIUM CHLORIDE 0.9 % IV SOLN
Freq: Once | INTRAVENOUS | Status: AC
  Administered 2012-08-08: 15:00:00 via INTRAVENOUS

## 2012-08-08 MED ORDER — SODIUM CHLORIDE 0.9 % IV SOLN: INTRAVENOUS | Status: DC

## 2012-08-08 NOTE — Progress Notes (Signed)
#  16 gauge needle used for phlebotomy to left antecubital. 500 cc of blood removed. Patient tolerated procedure well.

## 2012-08-08 NOTE — Patient Instructions (Addendum)

## 2012-10-09 ENCOUNTER — Telehealth: Payer: Self-pay | Admitting: Oncology

## 2012-10-09 NOTE — Telephone Encounter (Signed)
pt called and  wanted to cancel no r/s

## 2012-10-13 ENCOUNTER — Other Ambulatory Visit: Payer: 59

## 2012-10-20 ENCOUNTER — Other Ambulatory Visit (HOSPITAL_COMMUNITY): Payer: Self-pay | Admitting: Obstetrics and Gynecology

## 2012-10-20 DIAGNOSIS — Z1231 Encounter for screening mammogram for malignant neoplasm of breast: Secondary | ICD-10-CM

## 2012-11-01 ENCOUNTER — Other Ambulatory Visit: Payer: Self-pay

## 2012-11-12 ENCOUNTER — Ambulatory Visit (HOSPITAL_COMMUNITY)
Admission: RE | Admit: 2012-11-12 | Discharge: 2012-11-12 | Disposition: A | Discharge status: Home / Self Care | Payer: 59 | Source: Ambulatory Visit | Attending: Obstetrics and Gynecology | Admitting: Obstetrics and Gynecology

## 2012-11-12 DIAGNOSIS — Z1231 Encounter for screening mammogram for malignant neoplasm of breast: Secondary | ICD-10-CM | POA: Insufficient documentation

## 2013-01-08 ENCOUNTER — Telehealth: Payer: Self-pay | Admitting: Oncology

## 2013-01-09 ENCOUNTER — Telehealth: Payer: Self-pay | Admitting: Oncology

## 2013-01-12 ENCOUNTER — Other Ambulatory Visit: Payer: 59

## 2013-04-13 ENCOUNTER — Other Ambulatory Visit: Payer: 59

## 2013-07-13 ENCOUNTER — Ambulatory Visit: Payer: 59 | Admitting: Oncology

## 2013-07-13 ENCOUNTER — Other Ambulatory Visit: Payer: 59 | Admitting: Lab

## 2013-07-23 ENCOUNTER — Other Ambulatory Visit: Payer: Self-pay

## 2013-10-06 LAB — CBC AND DIFFERENTIAL
HEMATOCRIT: 44 % (ref 36–46)
Hemoglobin: 14.7 g/dL (ref 12.0–16.0)
NEUTROS ABS: 5 /uL
PLATELETS: 223 10*3/uL (ref 150–399)
WBC: 7.9 10^3/mL

## 2013-10-06 LAB — BASIC METABOLIC PANEL
BUN: 16 mg/dL (ref 4–21)
Potassium: 4.5 mmol/L (ref 3.4–5.3)
Sodium: 135 mmol/L — AB (ref 137–147)

## 2013-10-07 ENCOUNTER — Other Ambulatory Visit (HOSPITAL_COMMUNITY): Payer: Self-pay | Admitting: Obstetrics and Gynecology

## 2013-10-07 DIAGNOSIS — Z1231 Encounter for screening mammogram for malignant neoplasm of breast: Secondary | ICD-10-CM

## 2013-11-16 ENCOUNTER — Ambulatory Visit (HOSPITAL_COMMUNITY): Payer: 59

## 2014-10-06 LAB — HEPATIC FUNCTION PANEL
ALK PHOS: 74 U/L (ref 25–125)
ALT: 29 U/L (ref 7–35)
AST: 35 U/L (ref 13–35)

## 2014-10-06 LAB — LIPID PANEL
Cholesterol: 264 mg/dL — AB (ref 0–200)
HDL: 86 mg/dL — AB (ref 35–70)
LDL CALC: 166 mg/dL
TRIGLYCERIDES: 195 mg/dL — AB (ref 40–160)

## 2014-10-06 LAB — BASIC METABOLIC PANEL
CREATININE: 0.8 mg/dL (ref <=1.1)
Glucose: 99 mg/dL

## 2015-03-18 LAB — HM MAMMOGRAPHY: HM Mammogram: NORMAL (ref 0–4)

## 2015-03-18 LAB — HM PAP SMEAR

## 2016-01-18 ENCOUNTER — Ambulatory Visit (INDEPENDENT_AMBULATORY_CARE_PROVIDER_SITE_OTHER): Payer: 59 | Admitting: Family Medicine

## 2016-01-18 ENCOUNTER — Encounter: Payer: Self-pay | Admitting: Family Medicine

## 2016-01-18 VITALS — BP 130/80 | HR 76 | Temp 98.1°F | Resp 16 | Ht 64.0 in | Wt 155.0 lb

## 2016-01-18 DIAGNOSIS — F418 Other specified anxiety disorders: Secondary | ICD-10-CM

## 2016-01-18 DIAGNOSIS — F1020 Alcohol dependence, uncomplicated: Secondary | ICD-10-CM

## 2016-01-18 DIAGNOSIS — I10 Essential (primary) hypertension: Secondary | ICD-10-CM | POA: Insufficient documentation

## 2016-01-18 DIAGNOSIS — F32A Depression, unspecified: Secondary | ICD-10-CM | POA: Insufficient documentation

## 2016-01-18 DIAGNOSIS — IMO0001 Reserved for inherently not codable concepts without codable children: Secondary | ICD-10-CM | POA: Insufficient documentation

## 2016-01-18 DIAGNOSIS — F329 Major depressive disorder, single episode, unspecified: Secondary | ICD-10-CM

## 2016-01-18 DIAGNOSIS — F419 Anxiety disorder, unspecified: Secondary | ICD-10-CM

## 2016-01-18 MED ORDER — CITALOPRAM HYDROBROMIDE 20 MG PO TABS: 20.0000 mg | ORAL_TABLET | Freq: Every day | ORAL | Status: DC

## 2016-01-18 NOTE — Patient Instructions (Signed)
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  We're glad to have you!

## 2016-01-18 NOTE — Progress Notes (Signed)
   Subjective:    Patient ID: Joyce Hoffman, female    DOB: May 29, 1961, 55 y.o.   MRN: [id number]  HPI New to establish.  Previous MD- Yehuda BuddSpears.  GYN- [facility]  GI- Mann  HTN- chronic problem, adequate control on HCTZ daily.  Denies CP, SOB, HAs, visual changes, edema.  Anxiety/depression- chronic problem, on Zoloft 25mg  daily.  Pt reports that for last 6 months she has been 'very short tempered'.  Pt is asking to try something different, 'i just don't think it's doing what it should'.  No side effects from medication.  Pt has had increased tearfulness.  Sleeping well.  Decreased motivation and energy level.  Alcoholism- ongoing issue.  She has relapsed recently when she and her bf separated.  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  Drank 1 pint last week and has not had any alcohol this week.   Review of Systems For ROS see HPI     Objective:   Physical Exam  Constitutional: She is oriented to person, place, and time. She appears well-developed and well-nourished. No distress.  HENT:  Head: Normocephalic and atraumatic.  Eyes: Conjunctivae and EOM are normal. Pupils are equal, round, and reactive to light.  Neck: Normal range of motion. Neck supple. No thyromegaly present.  Cardiovascular: Normal rate, regular rhythm, normal heart sounds and intact distal pulses.   No murmur heard. Pulmonary/Chest: Effort normal and breath sounds normal. No respiratory distress.  Abdominal: Soft. She exhibits no distension. There is no tenderness.  Musculoskeletal: She exhibits no edema.  Lymphadenopathy:    She has no cervical adenopathy.  Neurological: She is alert and oriented to person, place, and time.  Skin: Skin is warm and dry.  Psychiatric: She has a normal mood and affect. Her behavior is normal.  Vitals reviewed.         Assessment & Plan:

## 2016-01-18 NOTE — Progress Notes (Signed)
Pre visit review using our clinic review tool, if applicable. No additional management support is needed unless otherwise documented below in the visit note. 

## 2016-01-20 NOTE — Assessment & Plan Note (Signed)
New to provider, ongoing for pt.  She does not feel that Zoloft is controlling her sxs.  + fatigue.  Based on her low energy and motivation we'll switch to Celexa and monitor for symptom improvement.  Pt expressed understanding and is in agreement w/ plan.

## 2016-01-20 NOTE — Assessment & Plan Note (Signed)
New to provider, ongoing for pt.  She admits that she did drink a fifth after breaking up w/ her boyfriend but she has not drank since.  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  Discussed resources such as AA- pt prefers to focus on her faith and her farm at this time.  Will follow closely.

## 2016-01-20 NOTE — Assessment & Plan Note (Signed)
New to provider, ongoing for pt.  On HCTZ daily w/ good control.  Currently asymptomatic.  Check labs.  No anticipated med changes.

## 2016-03-08 ENCOUNTER — Ambulatory Visit: Payer: Self-pay | Admitting: Family Medicine

## 2016-03-08 ENCOUNTER — Telehealth: Payer: Self-pay | Admitting: Family Medicine

## 2016-03-08 DIAGNOSIS — Z0289 Encounter for other administrative examinations: Secondary | ICD-10-CM

## 2016-03-08 NOTE — Telephone Encounter (Signed)
This is a no-show for pt.  If she wants to reschedule, it will be in the next available new pt spot

## 2016-03-08 NOTE — Telephone Encounter (Signed)
Just FYI,Pt husband called at 12:58 stating that pt was not feeling well and asking if we had an earlier appt. I told him that we did not that we were booked up and that she did have her appt at 2:30 today. Husband stated ok. Husband then called back at 1:59 and canceled the appt.

## 2016-03-13 ENCOUNTER — Telehealth: Payer: Self-pay | Admitting: Family Medicine

## 2016-03-13 MED ORDER — HYDROCHLOROTHIAZIDE 12.5 MG PO TABS: 12.5000 mg | ORAL_TABLET | Freq: Every day | ORAL | Status: DC

## 2016-03-13 MED ORDER — CITALOPRAM HYDROBROMIDE 20 MG PO TABS: 20.0000 mg | ORAL_TABLET | Freq: Every day | ORAL | Status: DC

## 2016-03-13 NOTE — Telephone Encounter (Signed)
Just FYI:Pt called to reschedule her NP appt.from No showing her appt. 03/08/16.(Canceled last min) She was scheduled in Sept. (next avail.) Pt asked about med refills and I advise her that she may need to contact previous doctor to see about refills. Pt states that office has closed over a year ago. I then advised her she may need to go to an urgent care if she runs out of her meds.

## 2016-03-13 NOTE — Telephone Encounter (Signed)
Patient was No Show on 6/22. Charge or No Charge?

## 2016-03-13 NOTE — Telephone Encounter (Signed)
Medication filled to pharmacy as requested.   

## 2016-03-13 NOTE — Telephone Encounter (Signed)
We can refill meds as I have seen her previously

## 2016-03-14 NOTE — Telephone Encounter (Signed)
Yes- please charge 

## 2016-03-16 ENCOUNTER — Encounter: Payer: Self-pay | Admitting: Family Medicine

## 2016-04-18 ENCOUNTER — Other Ambulatory Visit: Payer: Self-pay | Admitting: General Practice

## 2016-04-18 MED ORDER — CITALOPRAM HYDROBROMIDE 20 MG PO TABS: 20.0000 mg | ORAL_TABLET | Freq: Every day | ORAL | 1 refills | Status: DC

## 2016-05-08 ENCOUNTER — Telehealth: Payer: Self-pay | Admitting: Family Medicine

## 2016-05-08 NOTE — Telephone Encounter (Signed)
It is not office policy to waive the no-show fee when appt is cancelled AFTER the scheduled appt time.  We are supposed to get 24 hr notice for cancellations.  I am fairly lenient on this and will accept late cancellations when situations arise and pts alert us, but calling after she missed the appt gets a no-show fee.  I would have been happy to see her and address her acute illness at her scheduled appt time

## 2016-05-08 NOTE — Telephone Encounter (Signed)
Pt states that she received a bill for NS fee from 6/22 NP appt. Pt was upset that she received a bill for this when I explained how phone call went with Husband, which was. Husband had called earlier that day asking for a earlier appt due to pt  Not feeling well. I let him know that we did not have anything earlier. Husband states that he may have to take pt to urgent care then. I stated to call back and let me know to cancel appt. Husband stated an understanding. Husband called back after 2:30 to cancel appt. I then stated to pt that I will explain this to KT, but was not sure if fee would be waived.

## 2016-05-09 NOTE — Telephone Encounter (Signed)
Called pt and LMOVM explaining our office policy.

## 2016-05-16 NOTE — Telephone Encounter (Signed)
Dr. Beverely Lowabori,  Since this patient missed the NP appointment should you be removed from the banner as PCP?

## 2016-05-16 NOTE — Telephone Encounter (Signed)
I did see her on 5/3 for issues so I will remain on the banner at this time.

## 2016-06-07 ENCOUNTER — Ambulatory Visit: Payer: 59 | Admitting: Family Medicine

## 2016-08-23 ENCOUNTER — Other Ambulatory Visit (HOSPITAL_BASED_OUTPATIENT_CLINIC_OR_DEPARTMENT_OTHER): Payer: Self-pay | Admitting: Physician Assistant

## 2016-08-23 DIAGNOSIS — Z1231 Encounter for screening mammogram for malignant neoplasm of breast: Secondary | ICD-10-CM

## 2016-08-23 DIAGNOSIS — M5412 Radiculopathy, cervical region: Secondary | ICD-10-CM

## 2016-09-06 ENCOUNTER — Ambulatory Visit (HOSPITAL_BASED_OUTPATIENT_CLINIC_OR_DEPARTMENT_OTHER): Payer: 59

## 2017-01-30 DIAGNOSIS — J309 Allergic rhinitis, unspecified: Secondary | ICD-10-CM | POA: Diagnosis not present

## 2017-01-30 DIAGNOSIS — R002 Palpitations: Secondary | ICD-10-CM | POA: Diagnosis not present

## 2017-01-30 DIAGNOSIS — E785 Hyperlipidemia, unspecified: Secondary | ICD-10-CM | POA: Diagnosis not present

## 2017-02-26 DIAGNOSIS — H2513 Age-related nuclear cataract, bilateral: Secondary | ICD-10-CM | POA: Diagnosis not present

## 2017-02-26 DIAGNOSIS — H25011 Cortical age-related cataract, right eye: Secondary | ICD-10-CM | POA: Diagnosis not present

## 2017-03-12 DIAGNOSIS — I10 Essential (primary) hypertension: Secondary | ICD-10-CM | POA: Diagnosis not present

## 2017-03-21 ENCOUNTER — Emergency Department (HOSPITAL_COMMUNITY): Payer: BLUE CROSS/BLUE SHIELD

## 2017-03-21 ENCOUNTER — Encounter (HOSPITAL_COMMUNITY): Payer: Self-pay

## 2017-03-21 ENCOUNTER — Inpatient Hospital Stay (HOSPITAL_COMMUNITY)
Admission: EM | Admit: 2017-03-21 | Discharge: 2017-03-23 | DRG: 641 | Disposition: A | Discharge status: Home / Self Care | Payer: BLUE CROSS/BLUE SHIELD | Attending: Internal Medicine | Admitting: Internal Medicine

## 2017-03-21 DIAGNOSIS — Z79899 Other long term (current) drug therapy: Secondary | ICD-10-CM

## 2017-03-21 DIAGNOSIS — R112 Nausea with vomiting, unspecified: Secondary | ICD-10-CM | POA: Diagnosis not present

## 2017-03-21 DIAGNOSIS — N281 Cyst of kidney, acquired: Secondary | ICD-10-CM | POA: Diagnosis present

## 2017-03-21 DIAGNOSIS — I10 Essential (primary) hypertension: Secondary | ICD-10-CM | POA: Diagnosis present

## 2017-03-21 DIAGNOSIS — R945 Abnormal results of liver function studies: Secondary | ICD-10-CM | POA: Diagnosis not present

## 2017-03-21 DIAGNOSIS — F1020 Alcohol dependence, uncomplicated: Secondary | ICD-10-CM | POA: Diagnosis not present

## 2017-03-21 DIAGNOSIS — E878 Other disorders of electrolyte and fluid balance, not elsewhere classified: Secondary | ICD-10-CM | POA: Diagnosis present

## 2017-03-21 DIAGNOSIS — N3000 Acute cystitis without hematuria: Secondary | ICD-10-CM | POA: Diagnosis not present

## 2017-03-21 DIAGNOSIS — E876 Hypokalemia: Secondary | ICD-10-CM | POA: Diagnosis present

## 2017-03-21 DIAGNOSIS — Z886 Allergy status to analgesic agent status: Secondary | ICD-10-CM

## 2017-03-21 DIAGNOSIS — E785 Hyperlipidemia, unspecified: Secondary | ICD-10-CM | POA: Diagnosis not present

## 2017-03-21 DIAGNOSIS — R002 Palpitations: Secondary | ICD-10-CM | POA: Diagnosis present

## 2017-03-21 DIAGNOSIS — R739 Hyperglycemia, unspecified: Secondary | ICD-10-CM | POA: Diagnosis present

## 2017-03-21 DIAGNOSIS — R7989 Other specified abnormal findings of blood chemistry: Secondary | ICD-10-CM | POA: Diagnosis present

## 2017-03-21 DIAGNOSIS — E871 Hypo-osmolality and hyponatremia: Secondary | ICD-10-CM | POA: Diagnosis present

## 2017-03-21 DIAGNOSIS — E872 Acidosis, unspecified: Secondary | ICD-10-CM | POA: Diagnosis present

## 2017-03-21 DIAGNOSIS — Z66 Do not resuscitate: Secondary | ICD-10-CM | POA: Diagnosis not present

## 2017-03-21 DIAGNOSIS — R404 Transient alteration of awareness: Secondary | ICD-10-CM | POA: Diagnosis not present

## 2017-03-21 DIAGNOSIS — Z635 Disruption of family by separation and divorce: Secondary | ICD-10-CM | POA: Diagnosis not present

## 2017-03-21 DIAGNOSIS — F329 Major depressive disorder, single episode, unspecified: Secondary | ICD-10-CM | POA: Diagnosis present

## 2017-03-21 DIAGNOSIS — E860 Dehydration: Secondary | ICD-10-CM | POA: Diagnosis present

## 2017-03-21 DIAGNOSIS — R109 Unspecified abdominal pain: Secondary | ICD-10-CM | POA: Diagnosis not present

## 2017-03-21 DIAGNOSIS — Z833 Family history of diabetes mellitus: Secondary | ICD-10-CM

## 2017-03-21 DIAGNOSIS — IMO0001 Reserved for inherently not codable concepts without codable children: Secondary | ICD-10-CM | POA: Diagnosis present

## 2017-03-21 DIAGNOSIS — Z87891 Personal history of nicotine dependence: Secondary | ICD-10-CM

## 2017-03-21 DIAGNOSIS — F419 Anxiety disorder, unspecified: Secondary | ICD-10-CM | POA: Diagnosis not present

## 2017-03-21 DIAGNOSIS — F32A Depression, unspecified: Secondary | ICD-10-CM | POA: Diagnosis present

## 2017-03-21 DIAGNOSIS — R1013 Epigastric pain: Secondary | ICD-10-CM | POA: Diagnosis not present

## 2017-03-21 DIAGNOSIS — R1110 Vomiting, unspecified: Secondary | ICD-10-CM | POA: Diagnosis not present

## 2017-03-21 DIAGNOSIS — B9620 Unspecified Escherichia coli [E. coli] as the cause of diseases classified elsewhere: Secondary | ICD-10-CM | POA: Diagnosis present

## 2017-03-21 DIAGNOSIS — R531 Weakness: Secondary | ICD-10-CM | POA: Diagnosis not present

## 2017-03-21 HISTORY — DX: Hyperlipidemia, unspecified: E78.5

## 2017-03-21 LAB — CBC WITH DIFFERENTIAL/PLATELET
BASOS ABS: 0 10*3/uL (ref 0.0–0.1)
Basophils Relative: 0 %
EOS ABS: 0 10*3/uL (ref 0.0–0.7)
EOS PCT: 0 %
HCT: 47.8 % — ABNORMAL HIGH (ref 36.0–46.0)
Hemoglobin: 17.9 g/dL — ABNORMAL HIGH (ref 12.0–15.0)
LYMPHS PCT: 12 %
Lymphs Abs: 1.3 10*3/uL (ref 0.7–4.0)
MCH: 36.2 pg — ABNORMAL HIGH (ref 26.0–34.0)
MCHC: 37.4 g/dL — ABNORMAL HIGH (ref 30.0–36.0)
MCV: 96.6 fL (ref 78.0–100.0)
Monocytes Absolute: 0.7 10*3/uL (ref 0.1–1.0)
Monocytes Relative: 6 %
Neutro Abs: 9.4 10*3/uL — ABNORMAL HIGH (ref 1.7–7.7)
Neutrophils Relative %: 82 %
PLATELETS: 226 10*3/uL (ref 150–400)
RBC: 4.95 MIL/uL (ref 3.87–5.11)
RDW: 13.2 % (ref 11.5–15.5)
WBC: 11.4 10*3/uL — AB (ref 4.0–10.5)

## 2017-03-21 LAB — URINALYSIS, ROUTINE W REFLEX MICROSCOPIC
BILIRUBIN URINE: NEGATIVE
GLUCOSE, UA: NEGATIVE mg/dL
KETONES UR: 20 mg/dL — AB
Nitrite: POSITIVE — AB
PH: 6 (ref 5.0–8.0)
Protein, ur: 100 mg/dL — AB
Specific Gravity, Urine: 1.013 (ref 1.005–1.030)

## 2017-03-21 LAB — I-STAT BETA HCG BLOOD, ED (MC, WL, AP ONLY): I-stat hCG, quantitative: 11 m[IU]/mL — ABNORMAL HIGH (ref <5)

## 2017-03-21 LAB — RAPID URINE DRUG SCREEN, HOSP PERFORMED
AMPHETAMINES: NOT DETECTED
BENZODIAZEPINES: NOT DETECTED
Barbiturates: NOT DETECTED
COCAINE: NOT DETECTED
OPIATES: POSITIVE — AB
TETRAHYDROCANNABINOL: POSITIVE — AB

## 2017-03-21 LAB — COMPREHENSIVE METABOLIC PANEL
ALT: 36 U/L (ref 14–54)
AST: 62 U/L — ABNORMAL HIGH (ref 15–41)
Albumin: 5.8 g/dL — ABNORMAL HIGH (ref 3.5–5.0)
Alkaline Phosphatase: 115 U/L (ref 38–126)
Anion gap: 21 — ABNORMAL HIGH (ref 5–15)
BUN: 20 mg/dL (ref 6–20)
CALCIUM: 11.3 mg/dL — AB (ref 8.9–10.3)
CHLORIDE: 94 mmol/L — AB (ref 101–111)
CO2: 16 mmol/L — ABNORMAL LOW (ref 22–32)
CREATININE: 0.75 mg/dL (ref 0.44–1.00)
Glucose, Bld: 126 mg/dL — ABNORMAL HIGH (ref 65–99)
Potassium: 2.8 mmol/L — ABNORMAL LOW (ref 3.5–5.1)
Sodium: 131 mmol/L — ABNORMAL LOW (ref 135–145)
Total Bilirubin: 1.9 mg/dL — ABNORMAL HIGH (ref 0.3–1.2)
Total Protein: 9.2 g/dL — ABNORMAL HIGH (ref 6.5–8.1)

## 2017-03-21 LAB — LIPASE, BLOOD: LIPASE: 43 U/L (ref 11–51)

## 2017-03-21 LAB — ETHANOL: Alcohol, Ethyl (B): <5 mg/dL (ref <5)

## 2017-03-21 LAB — TSH: TSH: 1.787 u[IU]/mL (ref 0.350–4.500)

## 2017-03-21 MED ORDER — PANTOPRAZOLE SODIUM 40 MG IV SOLR
40.0000 mg | Freq: Every day | INTRAVENOUS | Status: DC
  Administered 2017-03-21 – 2017-03-22 (×2): 40 mg via INTRAVENOUS
  Filled 2017-03-21 (×2): qty 40

## 2017-03-21 MED ORDER — FAMOTIDINE IN NACL 20-0.9 MG/50ML-% IV SOLN
20.0000 mg | Freq: Once | INTRAVENOUS | Status: AC
  Administered 2017-03-21: 20 mg via INTRAVENOUS
  Filled 2017-03-21: qty 50

## 2017-03-21 MED ORDER — DEXTROSE 5 % IV SOLN
1.0000 g | INTRAVENOUS | Status: DC
  Administered 2017-03-22 – 2017-03-23 (×2): 1 g via INTRAVENOUS
  Filled 2017-03-21 (×4): qty 10

## 2017-03-21 MED ORDER — ONDANSETRON HCL 4 MG/2ML IJ SOLN
4.0000 mg | Freq: Once | INTRAMUSCULAR | Status: AC
  Administered 2017-03-21: 4 mg via INTRAVENOUS
  Filled 2017-03-21: qty 2

## 2017-03-21 MED ORDER — ONDANSETRON HCL 4 MG/2ML IJ SOLN: 4.0000 mg | Freq: Four times a day (QID) | INTRAMUSCULAR | Status: DC | PRN

## 2017-03-21 MED ORDER — POTASSIUM CHLORIDE 10 MEQ/100ML IV SOLN
10.0000 meq | Freq: Once | INTRAVENOUS | Status: AC
  Administered 2017-03-21: 10 meq via INTRAVENOUS
  Filled 2017-03-21: qty 100

## 2017-03-21 MED ORDER — DEXTROSE 5 % IV SOLN
1.0000 g | Freq: Once | INTRAVENOUS | Status: AC
  Administered 2017-03-21: 1 g via INTRAVENOUS
  Filled 2017-03-21: qty 10

## 2017-03-21 MED ORDER — IOPAMIDOL (ISOVUE-300) INJECTION 61%
100.0000 mL | Freq: Once | INTRAVENOUS | Status: AC | PRN
  Administered 2017-03-21: 100 mL via INTRAVENOUS

## 2017-03-21 MED ORDER — SODIUM CHLORIDE 0.9 % IV BOLUS (SEPSIS)
1000.0000 mL | Freq: Once | INTRAVENOUS | Status: AC
  Administered 2017-03-21: 1000 mL via INTRAVENOUS

## 2017-03-21 MED ORDER — FLUOXETINE HCL 20 MG PO CAPS
40.0000 mg | ORAL_CAPSULE | Freq: Every day | ORAL | Status: DC
  Administered 2017-03-22 – 2017-03-23 (×2): 40 mg via ORAL
  Filled 2017-03-21 (×2): qty 2

## 2017-03-21 MED ORDER — SODIUM CHLORIDE 0.9 % IV SOLN
INTRAVENOUS | Status: DC
  Administered 2017-03-21 – 2017-03-22 (×3): via INTRAVENOUS

## 2017-03-21 MED ORDER — ONDANSETRON HCL 4 MG PO TABS
4.0000 mg | ORAL_TABLET | Freq: Four times a day (QID) | ORAL | Status: DC | PRN
  Filled 2017-03-21: qty 1

## 2017-03-21 MED ORDER — ACETAMINOPHEN 650 MG RE SUPP: 650.0000 mg | Freq: Four times a day (QID) | RECTAL | Status: DC | PRN

## 2017-03-21 MED ORDER — MORPHINE SULFATE (PF) 4 MG/ML IV SOLN
4.0000 mg | Freq: Once | INTRAVENOUS | Status: AC
  Administered 2017-03-21: 4 mg via INTRAVENOUS
  Filled 2017-03-21: qty 1

## 2017-03-21 MED ORDER — ENOXAPARIN SODIUM 40 MG/0.4ML ~~LOC~~ SOLN
40.0000 mg | SUBCUTANEOUS | Status: DC
  Administered 2017-03-21 – 2017-03-22 (×2): 40 mg via SUBCUTANEOUS
  Filled 2017-03-21 (×2): qty 0.4

## 2017-03-21 MED ORDER — PRAVASTATIN SODIUM 10 MG PO TABS
20.0000 mg | ORAL_TABLET | Freq: Every day | ORAL | Status: DC
  Administered 2017-03-21 – 2017-03-23 (×3): 20 mg via ORAL
  Filled 2017-03-21 (×3): qty 2

## 2017-03-21 MED ORDER — ACETAMINOPHEN 325 MG PO TABS
650.0000 mg | ORAL_TABLET | Freq: Four times a day (QID) | ORAL | Status: DC | PRN
  Administered 2017-03-21: 650 mg via ORAL
  Filled 2017-03-21: qty 2

## 2017-03-21 NOTE — H&P (Signed)
History and Physical    Joyce Hoffman BMW:[id number] DOB: 1960/11/14 DOA: 03/21/2017  PCP: Deboraha Sprang at [facility] Consultants:  None Patient coming from: Home - lives alone; [place]: friend, Kathlene November, [phone]  Chief Complaint: N/V  HPI: Joyce Hoffman is a 56 y.o. female with medical history significant of HTN, HLD, h/o ETOH dependence, and Depression presenting with N/V.  Patient woke up Monday with N/V - but it passed.  Fine prior to Monday.  Since then, she drinks something, anything, and it comes back up. Tolerated a cup of water and this is the first thing she has tolerated all day.  No diarrhea.  No urinary symptoms.  Midepigastric abdominal pain.  Felt like she had throat swelling before she arrived in the ER, feels better now after drinking water.  No fevers.  No sick  contacts.  Yesterday she felt fine, ate a burger and had gatorade and tea without difficulty but she awoke again this AM with recurrence of symptoms and knew "I was gonna be dead if I didn't do something."  Got a new BP medication last Wednesday, took it that day and "I just ain't felt right since."  Heart fluttering.  SOB going up and down the steps.  Stopped taking the medication Monday (no meds for BP since).  Does not remember what she took prior.  Quit drinking about a month ago by using "Diffusion" - she reports that this is a prescription medication provided by her doctor that she takes as needed (?disulfiram).  Boyfriend died at the beginning of the year, mother had a stroke the same day, it's been one thing after another.  She did have a new, unprotected sexual encounter on Saturday, 2 days before the symptoms started.  She denies vaginal discharge.  She also acknowledges marijuana use as recently as yesterday.  ED Course: N/V/abdominal pain.  Metabolic acidosis on labs.  CT with ?gallbladder issue but RUQ [place] negative.  UA + - ?pyelo but no symptoms.  Given Rocephin.  Review of Systems: As per HPI; otherwise review of  systems reviewed and negative.   Ambulatory Status:  Ambulates without assistance  Past Medical History:  Diagnosis Date  . Alcoholism /alcohol abuse (HCC)   . Anxiety   . Depression   . Hemochromatosis 04/15/2012  . Hyperlipidemia   . Hypertension     Past Surgical History:  Procedure Laterality Date  . APPENDECTOMY    . HEMORRHOID SURGERY      Social History   Social History  . Marital status: Divorced    Spouse name: N/A  . Number of children: N/A  . Years of education: N/A   Occupational History  . retired from [employer]    Social History Main Topics  . Smoking status: Former Smoker    Quit date: 01/17/2001  . Smokeless tobacco: Never Used  . Alcohol use Yes     Comment: quit alcohol about a month ago; "I'm an alcoholic"  . Drug use: Yes    Types: Marijuana     Comment: last smoked yesterday  . Sexual activity: Not on file   Other Topics Concern  . Not on file   Social History Narrative  . No narrative on file    Allergies  Allergen Reactions  . Mobic [Meloxicam]     Swell up    Family History  Problem Relation Age of Onset  . Hypertension Mother 50  . CVA Mother   . Hypertension Maternal Grandmother   . Hypertension Maternal Grandfather   .  Diabetes Paternal Grandmother   . Hypertension Paternal Grandmother   . Hypertension Paternal Grandfather     Prior to Admission medications   Medication Sig Start Date End Date Taking? Authorizing Provider  bisoprolol-hydrochlorothiazide (ZIAC) 5-6.25 MG tablet Take 1 tablet by mouth daily.   Yes [provider]  FLUoxetine (PROZAC) 40 MG capsule Take 40 mg by mouth daily.   Yes [provider]  pravastatin (PRAVACHOL) 20 MG tablet Take 20 mg by mouth daily.   Yes [provider]    Physical Exam: Vitals:   03/21/17 1234 03/21/17 1550 03/21/17 1800 03/21/17 1842  BP:  129/79 131/88 (!) 153/90  Pulse:  88  90  Resp:  14 (!) 24 19  SpO2:  100% 100% 95%  Weight: 63.5 kg (140 lb)      Height: 5\' 4"  (1.626 m)        General:  Appears calm and comfortable and is NAD Eyes:  PERRL, EOMI, normal lids, iris ENT:  grossly normal hearing, lips & tongue, mmm Neck:  no LAD, masses or thyromegaly Cardiovascular: RRR, no m/r/g. No LE edema.  Respiratory:  CTA bilaterally, no w/r/r. Normal respiratory effort. Abdomen:  soft, midepigastric TTP, nd, NABS Skin:  no rash or induration seen on limited exam Musculoskeletal:  grossly normal tone BUE/BLE, good ROM, no bony abnormality Psychiatric:  grossly normal mood and affect, speech fluent and appropriate, AOx3 Neurologic:  CN 2-12 grossly intact, moves all extremities in coordinated fashion, sensation intact  Labs on Admission: I have personally reviewed following labs and imaging studies  CBC:  Recent Labs Lab 03/21/17 1243  WBC 11.4*  NEUTROABS 9.4*  HGB 17.9*  HCT 47.8*  MCV 96.6  PLT 226   Basic Metabolic Panel:  Recent Labs Lab 03/21/17 1243  NA 131*  K 2.8*  CL 94*  CO2 16*  GLUCOSE 126*  BUN 20  CREATININE 0.75  CALCIUM 11.3*   GFR: Estimated Creatinine Clearance: 67 mL/min (by C-G formula based on SCr of 0.75 mg/dL). Liver Function Tests:  Recent Labs Lab 03/21/17 1243  AST 62*  ALT 36  ALKPHOS 115  BILITOT 1.9*  PROT 9.2*  ALBUMIN 5.8*    Recent Labs Lab 03/21/17 1243  LIPASE 43   No results for input(s): AMMONIA in the last 168 hours. Coagulation Profile: No results for input(s): INR, PROTIME in the last 168 hours. Cardiac Enzymes: No results for input(s): CKTOTAL, CKMB, CKMBINDEX, TROPONINI in the last 168 hours. BNP (last 3 results) No results for input(s): PROBNP in the last 8760 hours. HbA1C: No results for input(s): HGBA1C in the last 72 hours. CBG: No results for input(s): GLUCAP in the last 168 hours. Lipid Profile: No results for input(s): CHOL, HDL, LDLCALC, TRIG, CHOLHDL, LDLDIRECT in the last 72 hours. Thyroid Function Tests: No results for input(s): TSH,  T4TOTAL, FREET4, T3FREE, THYROIDAB in the last 72 hours. Anemia Panel: No results for input(s): VITAMINB12, FOLATE, FERRITIN, TIBC, IRON, RETICCTPCT in the last 72 hours. Urine analysis:    Component Value Date/Time   COLORURINE YELLOW 03/21/2017 1240   APPEARANCEUR HAZY (A) 03/21/2017 1240   LABSPEC 1.013 03/21/2017 1240   PHURINE 6.0 03/21/2017 1240   GLUCOSEU NEGATIVE 03/21/2017 1240   HGBUR MODERATE (A) 03/21/2017 1240   BILIRUBINUR NEGATIVE 03/21/2017 1240   KETONESUR 20 (A) 03/21/2017 1240   PROTEINUR 100 (A) 03/21/2017 1240   UROBILINOGEN 0.2 01/28/2010 2004   NITRITE POSITIVE (A) 03/21/2017 1240   LEUKOCYTESUR SMALL (A) 03/21/2017 1240  Creatinine Clearance: Estimated Creatinine Clearance: 67 mL/min (by C-G formula based on SCr of 0.75 mg/dL).  Sepsis Labs: @LABRCNTIP (procalcitonin:4,lacticidven:4) )No results found for this or any previous visit (from the past 240 hour(s)).   Radiological Exams on Admission: Ct Abdomen Pelvis W Contrast  Result Date: 03/21/2017 CLINICAL DATA:  Nausea vomiting for several days. EXAM: CT ABDOMEN AND PELVIS WITH CONTRAST TECHNIQUE: Multidetector CT imaging of the abdomen and pelvis was performed using the standard protocol following bolus administration of intravenous contrast. CONTRAST:  100mL ISOVUE-300 IOPAMIDOL (ISOVUE-300) INJECTION 61% COMPARISON:  Abdominal radiograph 03/21/2017 FINDINGS: Lower chest: No acute abnormality. Hepatobiliary: Normal appearance of the liver. Mild irregular wall thickening of the gallbladder. No evidence of biliary ductal dilation. Pancreas: Unremarkable. No pancreatic ductal dilatation or surrounding inflammatory changes. Spleen: Normal in size without focal abnormality. Adrenals/Urinary Tract: Normal adrenal glands. 1.8 cm benign-appearing cyst in the upper pole of the right kidney. Other smaller too small to be accurately characterized bilateral renal circumscribed hypoattenuated masses. Stomach/Bowel: Stomach  is within normal limits. Post appendectomy. No evidence of bowel wall thickening, distention, or inflammatory changes. Scattered diverticular. Vascular/Lymphatic: Mild aortic atherosclerosis. No enlarged abdominal or pelvic lymph nodes. Reproductive: Uterus and bilateral adnexa are unremarkable. Other: No abdominal wall hernia or abnormality. No abdominopelvic ascites. Musculoskeletal: No acute or significant osseous findings. IMPRESSION: Mild irregular gallbladder wall thickening. Further evaluation with right upper quadrant ultrasound may be considered. Bilateral subcentimeter circumscribed renal masses. These may represent cysts, however are too small to be accurately characterized. No evidence of small-bowel obstruction. Scattered colonic diverticula without evidence of diverticulitis. Electronically Signed   By: Ted Mcalpineobrinka  Dimitrova M.D.   On: 03/21/2017 14:50   Dg Abd Acute W/chest  Result Date: 03/21/2017 CLINICAL DATA:  Nausea, vomiting, abdominal pain EXAM: DG ABDOMEN ACUTE W/ 1V CHEST COMPARISON:  Chest x-ray of 01/28/2010 FINDINGS: No active infiltrate or effusion is seen. Mediastinal and hilar contours are unremarkable. The heart is within normal limits in size. No bony abnormality is seen. Supine and erect views of the abdomen show no evidence of bowel obstruction. No free air is seen on the erect view. No opaque calculi are noted. The bones are unremarkable. IMPRESSION: 1. No active lung disease. 2. No bowel obstruction.  No free air Electronically Signed   By: Dwyane DeePaul  Barry M.D.   On: 03/21/2017 13:24   [place] Abdomen Limited Ruq  Result Date: 03/21/2017 CLINICAL DATA:  Nausea and vomiting. EXAM: ULTRASOUND ABDOMEN LIMITED RIGHT UPPER QUADRANT COMPARISON:  None. FINDINGS: Gallbladder: No gallstones or wall thickening visualized. No sonographic Murphy sign noted by sonographer. Common bile duct: Diameter: 2.8 mm Liver: No focal lesion identified. Within normal limits in parenchymal echogenicity.  IMPRESSION: Normal study with no cause for the patient's symptoms identified. Electronically Signed   By: Gerome Samavid  Williams III M.D   On: 03/21/2017 16:48    EKG: Independently reviewed.  Sinus tachycardia with rate 100; with no evidence of acute ischemia  Assessment/Plan Principal Problem:   Metabolic acidosis Active Problems:   Hemochromatosis   Alcoholism /alcohol abuse (HCC)   Anxiety and depression   Essential hypertension   Nausea & vomiting   Hyperglycemia   Abnormal liver function tests   Pertinent labs: HCG 11  Na++ 131 K+ 2.8 Chl 94 CO2 16 Glucose 126 Ca++ 11.3 (corrects to 9.9 with albumin) Albumin 5.8/Protein 9.2 AST 62/ALT 36 Bilirubin 1.9 WBC 11.4 Hgb 17.9 UA: few bacteria, moderate Hgb, 20 ketones, small LE, positive nitrite, TNTC WBC  N/V leading to metabolic  acidosis/abnormal LFTs -Patient with recurrent N/V with inability to tolerate PO since Monday (with the exception of yesterday, when she felt a bit better) -While she does not have abnormal renal function, she does appear mildly dehydrated with hyponatremia, hypochloremic/hypokalemic metabolic acidosis, hemoconcentration, and mild LFT elevation -DDx includes viral gastroenteritis (somewhat unusual without diarrhea); UTI (abnormal UA but could also be asymptomatic bacteriuria - will continue Rocephin for now while awaiting urine culture and other tests); acute hepatitis (food-borne or related to recent new sexual partner); other STI (as previously mentioned); cannabinoid hyperemesis syndrome; ETOH withdrawal (reports last use 1 month ago); other. -Will observe overnight and rehydrate -Supportive care with IVF, Zofran, Tylenol prn -Recheck LFTs in AM -Check HIV, RPR, urine GC/Chl, acute hepatitis panel -Gallbladder disease was a consideration (RUQ/midepigastric pain with n/v and ?abnormal CT), but RUQ [place] was negative and so this is unlikely -Unsure what to make of positive quant HCG.  While early pregnancy is  possible, it would be exceedingly unlikely given her age.  Instead, pelvic tumor would be of greater concern.  CT was unremarkable.  Will recheck HCG after hydration to determine if pelvic US is needed. -Needs ongoing counseling about marijuana use and the hazards associated.  UDS pending. -Needs ongoing encouragement about safe sex practices. -LFTs could also be reflective of statin use -With her AST:ALT ratio, it raises concern about ongoing ETOH use - which she denies.  ETOH negative in ER.  It is not clear what medication she is taking prn for ETOH dependence.  HTN -Patient recently started on Bystolic/HCTZ -Unclear what prior medication was -She reports DOE and heart fluttering, possibly related to this medication -She self-discontinued the medication on the day her current symptoms started, last Monday -It is quite unlikely that her current presentation is related to this medication, but will not restart it at this time -May need an alternative agent(s) for BP control but for now her BP is stable  Depression -Continue Prozac  Hemochromatosis -Patient with reported h/o hemochromatosis -This does not appear to be related to current complaints -Will check fasting transferrin saturation to ascertain whether additional evaluation/treatment is needed  Hyperglycemia -May be stress response -Will follow with fasting AM labs   DVT prophylaxis: Lovenox Code Status:  DNR - confirmed with patient Family Communication: None present Disposition Plan:  Home once clinically improved Consults called: None  Admission status: It is my clinical opinion that referral for OBSERVATION is reasonable and necessary in this patient based on the above information provided. The aforementioned taken together are felt to place the patient at high risk for further clinical deterioration. However it is anticipated that the patient may be medically stable for discharge from the hospital within 24 to 48  hours.    Jonah Blue MD [facility]  If 7PM-7AM, please contact night-coverage [email or web address] Password [facility]  03/21/2017, 6:54 PM

## 2017-03-21 NOTE — Progress Notes (Signed)
Pharmacy Antibiotic Note  Ignacia MarvelWendy Avitabile is a 56 y.o. female admitted on 03/21/2017 with UTI.  Pharmacy has been consulted for rocephin dosing.  Plan: Rocephin 1gm IV q24 hours F/u cultures and clinical course  Height: 5\' 4"  (162.6 cm) Weight: 140 lb (63.5 kg) IBW/kg (Calculated) : 54.7  No data recorded.   Recent Labs Lab 03/21/17 1243  WBC 11.4*  CREATININE 0.75    Estimated Creatinine Clearance: 67 mL/min (by C-G formula based on SCr of 0.75 mg/dL).    Allergies  Allergen Reactions  . Mobic [Meloxicam]     Swell up     Thank you for allowing pharmacy to be a part of this patient's care.  Woodfin GanjaSeay, Jalesia Loudenslager Poteet 03/21/2017 7:16 PM

## 2017-03-21 NOTE — ED Notes (Signed)
Pt to xray

## 2017-03-21 NOTE — ED Notes (Signed)
Notified pt. Of need of urine sample. States she will let me know when she is ready to give sample.

## 2017-03-21 NOTE — ED Triage Notes (Signed)
Pt complaining of N/V since Monday. States she has not been able to keep anything down. States she started a new BP medicine last week. She is having middle abdominal pain as well 8/10.   VSS CBG 138 NOT DIABETIC

## 2017-03-21 NOTE — ED Notes (Signed)
Pt to CT

## 2017-03-21 NOTE — ED Provider Notes (Signed)
[facility]-EMERGENCY DEPT Provider Note   CSN: [id number] Arrival date & time: 03/21/17  1230     History   Chief Complaint Chief Complaint  Patient presents with  . Nausea  . Emesis    HPI Joyce Hoffman is a 56 y.o. female.  HPI The patient presents to the emergency room for evaluation of nausea and vomiting. Patient states she started vomiting on Monday. She's had multiple episodes since then. Whenever she tries to eat or drink anything she can keep it down and vomits. She has discomfort in her upper abdomen and in her throat. She has pain when she tries to swallow. She denies any hematemesis. She denies any diarrhea. She denies any recent abdominal injuries. Past Medical History:  Diagnosis Date  . Alcoholism /alcohol abuse (HCC)   . Anxiety   . Depression   . Hemochromatosis 04/15/2012  . Hypertension     Patient Active Problem List   Diagnosis Date Noted  . Anxiety and depression 01/18/2016  . Essential hypertension 01/18/2016  . Alcoholism /alcohol abuse (HCC)   . Hemochromatosis 04/15/2012    Past Surgical History:  Procedure Laterality Date  . APPENDECTOMY    . HEMORRHOID SURGERY      OB History    No data available       Home Medications    Prior to Admission medications   Medication Sig Start Date End Date Taking? Authorizing Provider  bisoprolol-hydrochlorothiazide (ZIAC) 5-6.25 MG tablet Take 1 tablet by mouth daily.   Yes [provider]  FLUoxetine (PROZAC) 40 MG capsule Take 40 mg by mouth daily.   Yes [provider]  pravastatin (PRAVACHOL) 20 MG tablet Take 20 mg by mouth daily.   Yes [provider]    Family History Family History  Problem Relation Age of Onset  . Hypertension Mother   . Hypertension Maternal Grandmother   . Hypertension Maternal Grandfather   . Diabetes Paternal Grandmother   . Hypertension Paternal Grandmother   . Hypertension Paternal Grandfather     Social History Social History    Substance Use Topics  . Smoking status: Former Smoker    Quit date: 01/17/2001  . Smokeless tobacco: Never Used  . Alcohol use Yes     Allergies   Mobic [meloxicam]   Review of Systems Review of Systems  All other systems reviewed and are negative.    Physical Exam Updated Vital Signs BP 129/79 (BP Location: Left Arm)   Pulse 88   Resp 14   Ht 1.626 m (5\' 4" )   Wt 63.5 kg (140 lb)   SpO2 100%   BMI 24.03 kg/m   Physical Exam  Constitutional: She appears well-developed and well-nourished.  Actively vomiting  HENT:  Head: Normocephalic and atraumatic.  Right Ear: External ear normal.  Left Ear: External ear normal.  Eyes: Conjunctivae are normal. Right eye exhibits no discharge. Left eye exhibits no discharge. No scleral icterus.  Neck: Neck supple. No tracheal deviation present.  Cardiovascular: Normal rate, regular rhythm and intact distal pulses.   Pulmonary/Chest: Effort normal and breath sounds normal. No stridor. No respiratory distress. She has no wheezes. She has no rales.  Abdominal: Soft. Bowel sounds are normal. She exhibits no distension. There is tenderness ( Mild in the epigastric region). There is no rebound and no guarding. No hernia.  Musculoskeletal: She exhibits tenderness. She exhibits no edema.  Large bruise noted right anterior shin (patient states she tripped over her dog a couple weeks ago)  Neurological: She is alert. She has normal strength. No cranial nerve deficit (no facial droop, extraocular movements intact, no slurred speech) or sensory deficit. She exhibits normal muscle tone. She displays no seizure activity. Coordination normal.  Skin: Skin is warm and dry. No rash noted. She is not diaphoretic.  Psychiatric: She has a normal mood and affect.  Nursing note and vitals reviewed.    ED Treatments / Results  Labs (all labs ordered are listed, but only abnormal results are displayed) Labs Reviewed  COMPREHENSIVE METABOLIC PANEL -  Abnormal; Notable for the following:       Result Value   Sodium 131 (*)    Potassium 2.8 (*)    Chloride 94 (*)    CO2 16 (*)    Glucose, Bld 126 (*)    Calcium 11.3 (*)    Total Protein 9.2 (*)    Albumin 5.8 (*)    AST 62 (*)    Total Bilirubin 1.9 (*)    Anion gap 21 (*)    All other components within normal limits  CBC WITH DIFFERENTIAL/PLATELET - Abnormal; Notable for the following:    WBC 11.4 (*)    Hemoglobin 17.9 (*)    HCT 47.8 (*)    MCH 36.2 (*)    MCHC 37.4 (*)    Neutro Abs 9.4 (*)    All other components within normal limits  URINALYSIS, ROUTINE W REFLEX MICROSCOPIC - Abnormal; Notable for the following:    APPearance HAZY (*)    Hgb urine dipstick MODERATE (*)    Ketones, ur 20 (*)    Protein, ur 100 (*)    Nitrite POSITIVE (*)    Leukocytes, UA SMALL (*)    Bacteria, UA FEW (*)    Squamous Epithelial / LPF 0-5 (*)    All other components within normal limits  I-STAT BETA HCG BLOOD, ED ([facility], [facility], [facility] ONLY) - Abnormal; Notable for the following:    I-stat hCG, quantitative 11.0 (*)    All other components within normal limits  LIPASE, BLOOD  ETHANOL    EKG  EKG Interpretation  Date/Time:  Thursday March 21 2017 12:49:18 EDT Ventricular Rate:  100 PR Interval:    QRS Duration: 101 QT Interval:  396 QTC Calculation: 511 R Axis:   63 Text Interpretation:  Sinus tachycardia Prolonged QT interval Baseline wander in lead(s) I II aVR V1 V3 V4 Since last tracing rate faster Confirmed by Linwood Dibbles [phone]) on 03/21/2017 1:24:24 PM       Radiology Ct Abdomen Pelvis W Contrast  Result Date: 03/21/2017 CLINICAL DATA:  Nausea vomiting for several days. EXAM: CT ABDOMEN AND PELVIS WITH CONTRAST TECHNIQUE: Multidetector CT imaging of the abdomen and pelvis was performed using the standard protocol following bolus administration of intravenous contrast. CONTRAST:  ISOVUE-300 IOPAMIDOL (ISOVUE-300) INJECTION 61% COMPARISON:  Abdominal radiograph 03/21/2017  FINDINGS: Lower chest: No acute abnormality. Hepatobiliary: Normal appearance of the liver. Mild irregular wall thickening of the gallbladder. No evidence of biliary ductal dilation. Pancreas: Unremarkable. No pancreatic ductal dilatation or surrounding inflammatory changes. Spleen: Normal in size without focal abnormality. Adrenals/Urinary Tract: Normal adrenal glands. 1.8 cm benign-appearing cyst in the upper pole of the right kidney. Other smaller too small to be accurately characterized bilateral renal circumscribed hypoattenuated masses. Stomach/Bowel: Stomach is within normal limits. Post appendectomy. No evidence of bowel wall thickening, distention, or inflammatory changes. Scattered diverticular. Vascular/Lymphatic: Mild aortic atherosclerosis. No enlarged abdominal or pelvic lymph nodes. Reproductive: Uterus and  bilateral adnexa are unremarkable. Other: No abdominal wall hernia or abnormality. No abdominopelvic ascites. Musculoskeletal: No acute or significant osseous findings. IMPRESSION: Mild irregular gallbladder wall thickening. Further evaluation with right upper quadrant ultrasound may be considered. Bilateral subcentimeter circumscribed renal masses. These may represent cysts, however are too small to be accurately characterized. No evidence of small-bowel obstruction. Scattered colonic diverticula without evidence of diverticulitis. Electronically Signed   By: Ted Mcalpineobrinka  Dimitrova M.D.   On: 03/21/2017 14:50   Dg Abd Acute W/chest  Result Date: 03/21/2017 CLINICAL DATA:  Nausea, vomiting, abdominal pain EXAM: DG ABDOMEN ACUTE W/ 1V CHEST COMPARISON:  Chest x-ray of 01/28/2010 FINDINGS: No active infiltrate or effusion is seen. Mediastinal and hilar contours are unremarkable. The heart is within normal limits in size. No bony abnormality is seen. Supine and erect views of the abdomen show no evidence of bowel obstruction. No free air is seen on the erect view. No opaque calculi are noted. The  bones are unremarkable. IMPRESSION: 1. No active lung disease. 2. No bowel obstruction.  No free air Electronically Signed   By: Dwyane DeePaul  Barry M.D.   On: 03/21/2017 13:24   [place] Abdomen Limited Ruq  Result Date: 03/21/2017 CLINICAL DATA:  Nausea and vomiting. EXAM: ULTRASOUND ABDOMEN LIMITED RIGHT UPPER QUADRANT COMPARISON:  None. FINDINGS: Gallbladder: No gallstones or wall thickening visualized. No sonographic Murphy sign noted by sonographer. Common bile duct: Diameter: 2.8 mm Liver: No focal lesion identified. Within normal limits in parenchymal echogenicity. IMPRESSION: Normal study with no cause for the patient's symptoms identified. Electronically Signed   By: Gerome Samavid  Williams III M.D   On: 03/21/2017 16:48    Procedures Procedures (including critical care time)  Medications Ordered in ED Medications  sodium chloride 0.9 % bolus 1,000 mL (0 mLs Intravenous Stopped 03/21/17 1411)    And  0.9 %  sodium chloride infusion ( Intravenous Stopped 03/21/17 1551)  cefTRIAXone (ROCEPHIN) 1 g in dextrose 5 % 50 mL IVPB (not administered)  ondansetron (ZOFRAN) injection 4 mg (4 mg Intravenous Given 03/21/17 1253)  morphine 4 MG/ML injection 4 mg (4 mg Intravenous Given 03/21/17 1253)  famotidine (PEPCID) IVPB 20 mg premix (0 mg Intravenous Stopped 03/21/17 1329)  potassium chloride 10 mEq in 100 mL IVPB (0 mEq Intravenous Stopped 03/21/17 1551)  iopamidol (ISOVUE-300) 61 % injection 100 mL (100 mLs Intravenous Contrast Given 03/21/17 1432)     Initial Impression / Assessment and Plan / ED Course  I have reviewed the triage vital signs and the nursing notes.  Pertinent labs & imaging results that were available during my care of the patient were reviewed by me and considered in my medical decision making (see chart for details).  Clinical Course as of Mar 21 1740  Thu Mar 21, 2017  1402 Elevated istat hcg noted.  Doubt pt is pregnant and that level is clinically significant  [JK]    Clinical Course User  Index [JK] Linwood DibblesKnapp, Muhamed Luecke, MD   Pt presented to the ED with nausea vomiting and abdominal pain.  Labs notable for a metabolic acidosis.  CT scan with possible gallbladder findings however us is normal.   UA does show a UTI.  Sx could be related to pyelo.  Will start iv abx.  Consult with medical service   Final Clinical Impressions(s) / ED Diagnoses   Final diagnoses:  Intractable vomiting with nausea, unspecified vomiting type  Metabolic acidosis  Acute cystitis without hematuria  Hypokalemia    New Prescriptions New Prescriptions  No medications on file     Linwood Dibbles, MD 03/21/17 [phone]

## 2017-03-22 DIAGNOSIS — N3000 Acute cystitis without hematuria: Secondary | ICD-10-CM

## 2017-03-22 DIAGNOSIS — E872 Acidosis: Secondary | ICD-10-CM | POA: Diagnosis not present

## 2017-03-22 DIAGNOSIS — R945 Abnormal results of liver function studies: Secondary | ICD-10-CM

## 2017-03-22 DIAGNOSIS — E860 Dehydration: Secondary | ICD-10-CM | POA: Diagnosis present

## 2017-03-22 DIAGNOSIS — E785 Hyperlipidemia, unspecified: Secondary | ICD-10-CM | POA: Diagnosis present

## 2017-03-22 DIAGNOSIS — R002 Palpitations: Secondary | ICD-10-CM | POA: Diagnosis present

## 2017-03-22 DIAGNOSIS — I10 Essential (primary) hypertension: Secondary | ICD-10-CM | POA: Diagnosis present

## 2017-03-22 DIAGNOSIS — Z635 Disruption of family by separation and divorce: Secondary | ICD-10-CM | POA: Diagnosis not present

## 2017-03-22 DIAGNOSIS — Z87891 Personal history of nicotine dependence: Secondary | ICD-10-CM | POA: Diagnosis not present

## 2017-03-22 DIAGNOSIS — N281 Cyst of kidney, acquired: Secondary | ICD-10-CM | POA: Diagnosis present

## 2017-03-22 DIAGNOSIS — Z66 Do not resuscitate: Secondary | ICD-10-CM | POA: Diagnosis present

## 2017-03-22 DIAGNOSIS — F1020 Alcohol dependence, uncomplicated: Secondary | ICD-10-CM | POA: Diagnosis not present

## 2017-03-22 DIAGNOSIS — E876 Hypokalemia: Secondary | ICD-10-CM | POA: Diagnosis present

## 2017-03-22 DIAGNOSIS — R739 Hyperglycemia, unspecified: Secondary | ICD-10-CM | POA: Diagnosis present

## 2017-03-22 DIAGNOSIS — R112 Nausea with vomiting, unspecified: Secondary | ICD-10-CM | POA: Diagnosis present

## 2017-03-22 DIAGNOSIS — F419 Anxiety disorder, unspecified: Secondary | ICD-10-CM | POA: Diagnosis not present

## 2017-03-22 DIAGNOSIS — B9620 Unspecified Escherichia coli [E. coli] as the cause of diseases classified elsewhere: Secondary | ICD-10-CM | POA: Diagnosis present

## 2017-03-22 DIAGNOSIS — E871 Hypo-osmolality and hyponatremia: Secondary | ICD-10-CM | POA: Diagnosis present

## 2017-03-22 DIAGNOSIS — R1013 Epigastric pain: Secondary | ICD-10-CM | POA: Diagnosis present

## 2017-03-22 DIAGNOSIS — F329 Major depressive disorder, single episode, unspecified: Secondary | ICD-10-CM | POA: Diagnosis not present

## 2017-03-22 DIAGNOSIS — E878 Other disorders of electrolyte and fluid balance, not elsewhere classified: Secondary | ICD-10-CM | POA: Diagnosis present

## 2017-03-22 DIAGNOSIS — Z886 Allergy status to analgesic agent status: Secondary | ICD-10-CM | POA: Diagnosis not present

## 2017-03-22 LAB — BASIC METABOLIC PANEL
ANION GAP: 8 (ref 5–15)
ANION GAP: 7 (ref 5–15)
BUN: 9 mg/dL (ref 6–20)
BUN: <5 mg/dL — ABNORMAL LOW (ref 6–20)
CHLORIDE: 104 mmol/L (ref 101–111)
CO2: 22 mmol/L (ref 22–32)
CO2: 26 mmol/L (ref 22–32)
CREATININE: 0.57 mg/dL (ref 0.44–1.00)
Calcium: 8.4 mg/dL — ABNORMAL LOW (ref 8.9–10.3)
Calcium: 8.7 mg/dL — ABNORMAL LOW (ref 8.9–10.3)
Chloride: 106 mmol/L (ref 101–111)
Creatinine, Ser: 0.63 mg/dL (ref 0.44–1.00)
GFR calc Af Amer: >60 mL/min (ref >60)
GFR calc non Af Amer: >60 mL/min (ref >60)
GLUCOSE: 104 mg/dL — AB (ref 65–99)
Glucose, Bld: 105 mg/dL — ABNORMAL HIGH (ref 65–99)
POTASSIUM: 2.4 mmol/L — AB (ref 3.5–5.1)
POTASSIUM: 3.5 mmol/L (ref 3.5–5.1)
SODIUM: 134 mmol/L — AB (ref 135–145)
Sodium: 139 mmol/L (ref 135–145)

## 2017-03-22 LAB — TRANSFERRIN: Transferrin: 152 mg/dL — ABNORMAL LOW (ref 192–382)

## 2017-03-22 LAB — CBC
HCT: 36.1 % (ref 36.0–46.0)
Hemoglobin: 12.8 g/dL (ref 12.0–15.0)
MCH: 35.2 pg — ABNORMAL HIGH (ref 26.0–34.0)
MCHC: 35.5 g/dL (ref 30.0–36.0)
MCV: 99.2 fL (ref 78.0–100.0)
PLATELETS: 137 10*3/uL — AB (ref 150–400)
RBC: 3.64 MIL/uL — AB (ref 3.87–5.11)
RDW: 13.1 % (ref 11.5–15.5)
WBC: 6.6 10*3/uL (ref 4.0–10.5)

## 2017-03-22 LAB — HEPATIC FUNCTION PANEL
ALBUMIN: 3.5 g/dL (ref 3.5–5.0)
ALT: 20 U/L (ref 14–54)
AST: 29 U/L (ref 15–41)
Alkaline Phosphatase: 66 U/L (ref 38–126)
BILIRUBIN DIRECT: 0.2 mg/dL (ref 0.1–0.5)
Indirect Bilirubin: 0.7 mg/dL (ref 0.3–0.9)
TOTAL PROTEIN: 5.7 g/dL — AB (ref 6.5–8.1)
Total Bilirubin: 0.9 mg/dL (ref 0.3–1.2)

## 2017-03-22 LAB — HCG, QUANTITATIVE, PREGNANCY: HCG, BETA CHAIN, QUANT, S: 3 m[IU]/mL (ref <5)

## 2017-03-22 LAB — MAGNESIUM: MAGNESIUM: 1.6 mg/dL — AB (ref 1.7–2.4)

## 2017-03-22 MED ORDER — KCL IN DEXTROSE-NACL 40-5-0.9 MEQ/L-%-% IV SOLN
INTRAVENOUS | Status: DC
  Administered 2017-03-22 – 2017-03-23 (×4): via INTRAVENOUS

## 2017-03-22 MED ORDER — ONDANSETRON HCL 4 MG/2ML IJ SOLN
INTRAMUSCULAR | Status: AC
  Filled 2017-03-22: qty 2

## 2017-03-22 MED ORDER — ONDANSETRON HCL 4 MG/2ML IJ SOLN
4.0000 mg | INTRAMUSCULAR | Status: DC | PRN
  Administered 2017-03-22: 4 mg via INTRAVENOUS

## 2017-03-22 MED ORDER — LORAZEPAM 2 MG/ML IJ SOLN: 0.5000 mg | INTRAMUSCULAR | Status: DC | PRN

## 2017-03-22 MED ORDER — POTASSIUM CHLORIDE 10 MEQ/100ML IV SOLN
10.0000 meq | INTRAVENOUS | Status: AC
Start: 2017-03-22 — End: 2017-03-22
  Administered 2017-03-22 (×4): 10 meq via INTRAVENOUS
  Filled 2017-03-22 (×4): qty 100

## 2017-03-22 NOTE — Progress Notes (Addendum)
CRITICAL VALUE ALERT  Critical Value: Potassium 2.4  Date & Time Notied:  03/22/17 @ 0830  Provider Notified: Dr. Nedra HaiLee @ 9am  Orders Received/Actions taken: Orders entered by MD

## 2017-03-22 NOTE — Plan of Care (Signed)
Problem: Safety: Goal: Ability to remain free from injury will improve Outcome: Progressing Pt educated on being a moderate fall risk and importance of safety and fall prevention in the hospital. RN observed pt up to BR, pt has steady gait, no c/o weakness, dizziness, N/V. Will continue to monitor pt

## 2017-03-22 NOTE — Care Management Note (Signed)
Case Management Note  Patient Details  Name: Joyce Hoffman MRN: [id number] Date of Birth: August 18, 1961  Subjective/Objective:                  Admitted with metabolic acidosis, ? Pyelo. Pt is from home, lives alone and ind with ADL's. She is recently retired. She is listed as having no insurance but she says this is not true, she has a Insurance underwriter[employer]BS plan and has a card coming in the mail. [facility] [place]. She drives herself to appointments. She has no difficulty affording her medications. She communicates no needs at DC.   Action/Plan: DC home with self care. No CM needs anticipated. Will follow to DC.   Expected Discharge Date:      03/23/2017            Expected Discharge Plan:  Home/Self Care  In-House Referral:  NA  Discharge planning Services  CM Consult  Post Acute Care Choice:  NA Choice offered to:  NA  Status of Service:  Completed, signed off  Malcolm MetroChildress, Isaiha Asare Demske, RN 03/22/2017, 10:32 AM

## 2017-03-22 NOTE — Progress Notes (Addendum)
PROGRESS NOTE    Joyce Hoffman  ZOX:[id number] DOB: 1961-07-21 DOA: 03/21/2017 PCP: Sheliah Hatch, MD    Brief Narrative: 56 y o female with hx of prior ETOH abuse, last drink 2 months ago, undergoing stress with family including recent divorced, hemachromatosis, anxiety, depression, admitted for intractable nausea, vomiting and hypokalemia.  She had B Hcg of 11, with negative CT abd/pelvis.  Repeated pregnancy test was negative.  Lipase and LFT, along with RUQ [place] nagative.  Her UA was strongly positive.    Assessment & Plan:   Principal Problem:   Metabolic acidosis Active Problems:   Hemochromatosis   Alcoholism /alcohol abuse (HCC)   Anxiety and depression   Essential hypertension   Nausea & vomiting   Hyperglycemia   Abnormal liver function tests   1. Nausea and vomiting:  Unclear exact etiology, but could be pyelonephritis. Will continue with IV Rocephin and supportive care.  2. UTI:  Continue with IV Rocephin. 3. Hypokalemia:  Severe.  Will supplement.   Recheck.  Check Mag.  4. Hemachromatosis.  Noted.   DVT prophylaxis: Lovenox.  Code Status: Full Code.  Family Communication: none.  Disposition Plan: Home.   Consultants:   None.   Procedures:   None.   Antimicrobials: Anti-infectives    Start     Dose/Rate Route Frequency Ordered Stop   03/22/17 1000  cefTRIAXone (ROCEPHIN) 1 g in dextrose 5 % 50 mL IVPB     1 g 100 mL/hr over 30 Minutes Intravenous Every 24 hours 03/21/17 1915     03/21/17 1745  cefTRIAXone (ROCEPHIN) 1 g in dextrose 5 % 50 mL IVPB     1 g 100 mL/hr over 30 Minutes Intravenous  Once 03/21/17 1738 03/21/17 1855       Subjective:   Still nauseated.   Objective: Vitals:   03/21/17 1842 03/21/17 1953 03/21/17 2141 03/22/17 0548  BP: (!) 153/90 (!) 155/94 137/74 135/79  Pulse: 90 97 85 83  Resp: 19 20 20    Temp:  98.7 F (37.1 C) 99 F (37.2 C) 98.4 F (36.9 C)  TempSrc:  Oral Oral Oral  SpO2: 95% 100% 100% 100%  Weight:   64.4 kg (141 lb 14.4 oz)    Height:  5\' 4"  (1.626 m)      Intake/Output Summary (Last 24 hours) at 03/22/17 0924 Last data filed at 03/22/17 0921  Gross per 24 hour  Intake          1955.42 ml  Output              700 ml  Net          1255.42 ml   Filed Weights   03/21/17 1234 03/21/17 1953  Weight: 63.5 kg (140 lb) 64.4 kg (141 lb 14.4 oz)    Examination:  General exam: Appears calm and comfortable  Respiratory system: Clear to auscultation. Respiratory effort normal. Cardiovascular system: S1 & S2 heard, RRR. No JVD, murmurs, rubs, gallops or clicks. No pedal edema. Gastrointestinal system: Abdomen is nondistended, soft and nontender. No organomegaly or masses felt. Normal bowel sounds heard. Central nervous system: Alert and oriented. No focal neurological deficits. Extremities: Symmetric 5 x 5 power. Skin: No rashes, lesions or ulcers Psychiatry: Judgement and insight appear normal. Mood & affect appropriate.   Data Reviewed: I have personally reviewed following labs and imaging studies  CBC:  Recent Labs Lab 03/21/17 1243 03/22/17 0711  WBC 11.4* 6.6  NEUTROABS 9.4*  --   HGB  17.9* 12.8  HCT 47.8* 36.1  MCV 96.6 99.2  PLT 226 137*   Basic Metabolic Panel:  Recent Labs Lab 03/21/17 1243 03/22/17 0711  NA 131* 134*  K 2.8* 2.4*  CL 94* 104  CO2 16* 22  GLUCOSE 126* 105*  BUN 20 9  CREATININE 0.75 0.57  CALCIUM 11.3* 8.4*   GFR: Estimated Creatinine Clearance: 67 mL/min (by C-G formula based on SCr of 0.57 mg/dL). Liver Function Tests:  Recent Labs Lab 03/21/17 1243 03/22/17 0711  AST 62* 29  ALT 36 20  ALKPHOS 115 66  BILITOT 1.9* 0.9  PROT 9.2* 5.7*  ALBUMIN 5.8* 3.5    Recent Labs Lab 03/21/17 1243  LIPASE 43   Thyroid Function Tests:  Recent Labs  03/21/17 1243  TSH 1.787    Radiology Studies: Ct Abdomen Pelvis W Contrast  Result Date: 03/21/2017 CLINICAL DATA:  Nausea vomiting for several days. EXAM: CT ABDOMEN AND PELVIS  WITH CONTRAST TECHNIQUE: Multidetector CT imaging of the abdomen and pelvis was performed using the standard protocol following bolus administration of intravenous contrast. CONTRAST:  100mL ISOVUE-300 IOPAMIDOL (ISOVUE-300) INJECTION 61% COMPARISON:  Abdominal radiograph 03/21/2017 FINDINGS: Lower chest: No acute abnormality. Hepatobiliary: Normal appearance of the liver. Mild irregular wall thickening of the gallbladder. No evidence of biliary ductal dilation. Pancreas: Unremarkable. No pancreatic ductal dilatation or surrounding inflammatory changes. Spleen: Normal in size without focal abnormality. Adrenals/Urinary Tract: Normal adrenal glands. 1.8 cm benign-appearing cyst in the upper pole of the right kidney. Other smaller too small to be accurately characterized bilateral renal circumscribed hypoattenuated masses. Stomach/Bowel: Stomach is within normal limits. Post appendectomy. No evidence of bowel wall thickening, distention, or inflammatory changes. Scattered diverticular. Vascular/Lymphatic: Mild aortic atherosclerosis. No enlarged abdominal or pelvic lymph nodes. Reproductive: Uterus and bilateral adnexa are unremarkable. Other: No abdominal wall hernia or abnormality. No abdominopelvic ascites. Musculoskeletal: No acute or significant osseous findings. IMPRESSION: Mild irregular gallbladder wall thickening. Further evaluation with right upper quadrant ultrasound may be considered. Bilateral subcentimeter circumscribed renal masses. These may represent cysts, however are too small to be accurately characterized. No evidence of small-bowel obstruction. Scattered colonic diverticula without evidence of diverticulitis. Electronically Signed   By: Ted Mcalpineobrinka  Dimitrova M.D.   On: 03/21/2017 14:50   Dg Abd Acute W/chest  Result Date: 03/21/2017 CLINICAL DATA:  Nausea, vomiting, abdominal pain EXAM: DG ABDOMEN ACUTE W/ 1V CHEST COMPARISON:  Chest x-ray of 01/28/2010 FINDINGS: No active infiltrate or effusion  is seen. Mediastinal and hilar contours are unremarkable. The heart is within normal limits in size. No bony abnormality is seen. Supine and erect views of the abdomen show no evidence of bowel obstruction. No free air is seen on the erect view. No opaque calculi are noted. The bones are unremarkable. IMPRESSION: 1. No active lung disease. 2. No bowel obstruction.  No free air Electronically Signed   By: Dwyane DeePaul  Barry M.D.   On: 03/21/2017 13:24   [place] Abdomen Limited Ruq  Result Date: 03/21/2017 CLINICAL DATA:  Nausea and vomiting. EXAM: ULTRASOUND ABDOMEN LIMITED RIGHT UPPER QUADRANT COMPARISON:  None. FINDINGS: Gallbladder: No gallstones or wall thickening visualized. No sonographic Murphy sign noted by sonographer. Common bile duct: Diameter: 2.8 mm Liver: No focal lesion identified. Within normal limits in parenchymal echogenicity. IMPRESSION: Normal study with no cause for the patient's symptoms identified. Electronically Signed   By: Gerome Samavid  Williams III M.D   On: 03/21/2017 16:48    Scheduled Meds: . enoxaparin (LOVENOX) injection  40 mg Subcutaneous  Q24H  . FLUoxetine  40 mg Oral Daily  . pantoprazole (PROTONIX) IV  40 mg Intravenous q1800  . pravastatin  20 mg Oral q1800   Continuous Infusions: . cefTRIAXone (ROCEPHIN)  IV    . dextrose 5 % and 0.9 % NaCl with KCl 40 mEq/L    . potassium chloride       LOS: 0 days   Seymore Brodowski, MD FACP Hospitalist.   If 7PM-7AM, please contact night-coverage [email or web address] Password [email or web address] 03/22/2017, 9:24 AM

## 2017-03-23 DIAGNOSIS — F419 Anxiety disorder, unspecified: Secondary | ICD-10-CM

## 2017-03-23 DIAGNOSIS — F329 Major depressive disorder, single episode, unspecified: Secondary | ICD-10-CM

## 2017-03-23 LAB — BASIC METABOLIC PANEL
ANION GAP: 9 (ref 5–15)
BUN: <5 mg/dL — ABNORMAL LOW (ref 6–20)
CHLORIDE: 107 mmol/L (ref 101–111)
CO2: 27 mmol/L (ref 22–32)
Calcium: 9.1 mg/dL (ref 8.9–10.3)
Creatinine, Ser: 0.63 mg/dL (ref 0.44–1.00)
GFR calc non Af Amer: >60 mL/min (ref >60)
GLUCOSE: 110 mg/dL — AB (ref 65–99)
Potassium: 3.4 mmol/L — ABNORMAL LOW (ref 3.5–5.1)
Sodium: 143 mmol/L (ref 135–145)

## 2017-03-23 LAB — RPR: RPR: NONREACTIVE

## 2017-03-23 LAB — HEPATITIS PANEL, ACUTE
HCV AB: 1.7 {s_co_ratio} — AB (ref 0.0–0.9)
HEP A IGM: NEGATIVE
HEP B S AG: NEGATIVE
Hep B C IgM: NEGATIVE

## 2017-03-23 LAB — HIV ANTIBODY (ROUTINE TESTING W REFLEX): HIV SCREEN 4TH GENERATION: NONREACTIVE

## 2017-03-23 MED ORDER — CIPROFLOXACIN HCL 500 MG PO TABS: 500.0000 mg | ORAL_TABLET | Freq: Two times a day (BID) | ORAL | 0 refills | Status: AC

## 2017-03-23 MED ORDER — POTASSIUM CHLORIDE CRYS ER 20 MEQ PO TBCR: 40.0000 meq | EXTENDED_RELEASE_TABLET | Freq: Every day | ORAL | 0 refills | Status: AC

## 2017-03-23 MED ORDER — MAGNESIUM SULFATE 2 GM/50ML IV SOLN
2.0000 g | Freq: Once | INTRAVENOUS | Status: AC
  Administered 2017-03-23: 2 g via INTRAVENOUS
  Filled 2017-03-23: qty 50

## 2017-03-23 MED ORDER — POTASSIUM CHLORIDE CRYS ER 20 MEQ PO TBCR
40.0000 meq | EXTENDED_RELEASE_TABLET | Freq: Every day | ORAL | Status: DC
  Administered 2017-03-23: 40 meq via ORAL
  Filled 2017-03-23: qty 2

## 2017-03-23 MED ORDER — CIPROFLOXACIN HCL 250 MG PO TABS: 500.0000 mg | ORAL_TABLET | Freq: Two times a day (BID) | ORAL | Status: DC

## 2017-03-23 NOTE — Progress Notes (Signed)
Discharge instructions and prescription given, verbalized understanding, out in stable condition via w/c with staff. 

## 2017-03-23 NOTE — Discharge Summary (Signed)
Physician Discharge Summary  Bay Jarquin WUJ:[id number] DOB: 1961/06/02 DOA: 03/21/2017  PCP: Sheliah Hatch, MD  Admit date: 03/21/2017 Discharge date: 03/23/2017  Admitted From: Home.  Disposition:  Home.   Recommendations for Outpatient Follow-up:  1. Follow up with PCP in 1-2 weeks  Home Health: None.  Equipment/Devices: None.  Discharge Condition: Able to eat.  K 3.4.   CODE STATUS: FULL CODE.  Diet recommendation: As tolerated.   Brief/Interim Summary:  Patient was admitted for intractable nausea and vomiting by Dr Ophelia Charter.  As per her H and P:  " Joyce Hoffman is a 56 y.o. female with medical history significant of HTN, HLD, h/o ETOH dependence, and Depression presenting with N/V.  Patient woke up Monday with N/V - but it passed.  Fine prior to Monday.  Since then, she drinks something, anything, and it comes back up. Tolerated a cup of water and this is the first thing she has tolerated all day.  No diarrhea.  No urinary symptoms.  Midepigastric abdominal pain.  Felt like she had throat swelling before she arrived in the ER, feels better now after drinking water.  No fevers.  No sick  contacts.  Yesterday she felt fine, ate a burger and had gatorade and tea without difficulty but she awoke again this AM with recurrence of symptoms and knew "I was gonna be dead if I didn't do something."  Got a new BP medication last Wednesday, took it that day and "I just ain't felt right since."  Heart fluttering.  SOB going up and down the steps.  Stopped taking the medication Monday (no meds for BP since).  Does not remember what she took prior.  Quit drinking about a month ago by using "[employer]" - she reports that this is a prescription medication provided by her doctor that she takes as needed (?disulfiram).  Boyfriend died at the beginning of the year, mother had a stroke the same day, it's been one thing after another.  She did have a new, unprotected sexual encounter on Saturday, 2 days  before the symptoms started.  She denies vaginal discharge.  She also acknowledges marijuana use as recently as yesterday.  ED Course: N/V/abdominal pain.  Metabolic acidosis on labs.  CT with ?gallbladder issue but RUQ [place] negative.  UA + - ?pyelo but no symptoms.  Given Rocephin.  HOSPITAL COURSE:   56 y o female with hx of prior ETOH abuse, last drink 2 months ago, undergoing stress with family including recent divorced, hemachromatosis, anxiety, depression, admitted for intractable nausea, vomiting and hypokalemia.  She had B Hcg of 11, with negative CT abd/pelvis.  Repeated pregnancy test was negative.  Lipase and LFT, along with RUQ [place] was nagative.  Her UA was strongly positive, and she was given IV Rocephin.  Her urine culture grew E Coli, sensitivity is still pending.  She was found to have severe hypokalemia, and her K was supplemented aggressively with IV boluses.  Her K was given in her IVF as well.   Her Mag was checked and it was low at 1.6 mE/L.  Her HIV was negative, and other STD testings were still pending.  Her diet was advanced and she was able to tolerate it.   She is now ready for discharge.  Will give her another 5 days of Cipro 500mg  BID, along with 40 mEqs of potassium daily for 20 days.  She will need to follow up with her PCP next week, and will need blood work  done.  She also has renal cysts, and was told to follow up with her PCP as well.  Thank you for allowing me to participate in her care.  Good Day.   Discharge Diagnoses:  Principal Problem:   Metabolic acidosis Active Problems:   Hemochromatosis   Alcoholism /alcohol abuse (HCC)   Anxiety and depression   Essential hypertension   Nausea & vomiting   Hyperglycemia   Abnormal liver function tests   Intractable nausea and vomiting    Discharge Instructions  Discharge Instructions    Diet - low sodium heart healthy    Complete by:  As directed    Discharge instructions    Complete by:  As directed    Follow  up with your PCP next week.   Increase activity slowly    Complete by:  As directed      Allergies as of 03/23/2017      Reactions   Mobic [meloxicam]    Swell up      Medication List    TAKE these medications   bisoprolol-hydrochlorothiazide 5-6.25 MG tablet Commonly known as:  ZIAC Take 1 tablet by mouth daily.   ciprofloxacin 500 MG tablet Commonly known as:  CIPRO Take 1 tablet (500 mg total) by mouth 2 (two) times daily.   FLUoxetine 40 MG capsule Commonly known as:  PROZAC Take 40 mg by mouth daily.   potassium chloride SA 20 MEQ tablet Commonly known as:  K-DUR,KLOR-CON Take 2 tablets (40 mEq total) by mouth daily.   pravastatin 20 MG tablet Commonly known as:  PRAVACHOL Take 20 mg by mouth daily.       Allergies  Allergen Reactions  . Mobic [Meloxicam]     Swell up    Consultations:  NONE>    Procedures/Studies: Ct Abdomen Pelvis W Contrast  Result Date: 03/21/2017 CLINICAL DATA:  Nausea vomiting for several days. EXAM: CT ABDOMEN AND PELVIS WITH CONTRAST TECHNIQUE: Multidetector CT imaging of the abdomen and pelvis was performed using the standard protocol following bolus administration of intravenous contrast. CONTRAST:  ISOVUE-300 IOPAMIDOL (ISOVUE-300) INJECTION 61% COMPARISON:  Abdominal radiograph 03/21/2017 FINDINGS: Lower chest: No acute abnormality. Hepatobiliary: Normal appearance of the liver. Mild irregular wall thickening of the gallbladder. No evidence of biliary ductal dilation. Pancreas: Unremarkable. No pancreatic ductal dilatation or surrounding inflammatory changes. Spleen: Normal in size without focal abnormality. Adrenals/Urinary Tract: Normal adrenal glands. 1.8 cm benign-appearing cyst in the upper pole of the right kidney. Other smaller too small to be accurately characterized bilateral renal circumscribed hypoattenuated masses. Stomach/Bowel: Stomach is within normal limits. Post appendectomy. No evidence of bowel wall thickening,  distention, or inflammatory changes. Scattered diverticular. Vascular/Lymphatic: Mild aortic atherosclerosis. No enlarged abdominal or pelvic lymph nodes. Reproductive: Uterus and bilateral adnexa are unremarkable. Other: No abdominal wall hernia or abnormality. No abdominopelvic ascites. Musculoskeletal: No acute or significant osseous findings. IMPRESSION: Mild irregular gallbladder wall thickening. Further evaluation with right upper quadrant ultrasound may be considered. Bilateral subcentimeter circumscribed renal masses. These may represent cysts, however are too small to be accurately characterized. No evidence of small-bowel obstruction. Scattered colonic diverticula without evidence of diverticulitis. Electronically Signed   By: Ted Mcalpine M.D.   On: 03/21/2017 14:50   Dg Abd Acute W/chest  Result Date: 03/21/2017 CLINICAL DATA:  Nausea, vomiting, abdominal pain EXAM: DG ABDOMEN ACUTE W/ 1V CHEST COMPARISON:  Chest x-ray of 01/28/2010 FINDINGS: No active infiltrate or effusion is seen. Mediastinal and hilar contours are unremarkable. The heart is  within normal limits in size. No bony abnormality is seen. Supine and erect views of the abdomen show no evidence of bowel obstruction. No free air is seen on the erect view. No opaque calculi are noted. The bones are unremarkable. IMPRESSION: 1. No active lung disease. 2. No bowel obstruction.  No free air Electronically Signed   By: Dwyane DeePaul  Barry M.D.   On: 03/21/2017 13:24   [place] Abdomen Limited Ruq  Result Date: 03/21/2017 CLINICAL DATA:  Nausea and vomiting. EXAM: ULTRASOUND ABDOMEN LIMITED RIGHT UPPER QUADRANT COMPARISON:  None. FINDINGS: Gallbladder: No gallstones or wall thickening visualized. No sonographic Murphy sign noted by sonographer. Common bile duct: Diameter: 2.8 mm Liver: No focal lesion identified. Within normal limits in parenchymal echogenicity. IMPRESSION: Normal study with no cause for the patient's symptoms identified.  Electronically Signed   By: Gerome Samavid  Williams III M.D   On: 03/21/2017 16:48    Subjective: No complaints.   Discharge Exam: Vitals:   03/23/17 0500 03/23/17 1449  BP: 135/79 (!) 150/84  Pulse: 77 88  Resp: 20 15  Temp: 98.5 F (36.9 C) 98.8 F (37.1 C)   Vitals:   03/22/17 1345 03/22/17 2026 03/23/17 0500 03/23/17 1449  BP: 140/86 (!) 148/88 135/79 (!) 150/84  Pulse: 80 80 77 88  Resp: 18 20 20 15   Temp: 98.9 F (37.2 C) 98.3 F (36.8 C) 98.5 F (36.9 C) 98.8 F (37.1 C)  TempSrc: Oral Oral Oral Oral  SpO2: 100% 100% 100% 100%  Weight:      Height:        General: Pt is alert, awake, not in acute distress Cardiovascular: RRR, S1/S2 +, no rubs, no gallops Respiratory: CTA bilaterally, no wheezing, no rhonchi Abdominal: Soft, NT, ND, bowel sounds + Extremities: no edema, no cyanosis    The results of significant diagnostics from this hospitalization (including imaging, microbiology, ancillary and laboratory) are listed below for reference.     Microbiology: Recent Results (from the past 240 hour(s))  Culture, Urine     Status: Abnormal (Preliminary result)   Collection Time: 03/21/17 12:40 PM  Result Value Ref Range Status   Specimen Description URINE, RANDOM  Final   Special Requests NONE  Final   Culture (A)  Final    >=100,000 COLONIES/mL ESCHERICHIA COLI SUSCEPTIBILITIES TO FOLLOW Performed at [facility], [address] [place], [place] [id number]    Report Status PENDING  Incomplete     Recent Labs Lab 03/21/17 1243 03/22/17 0711 03/22/17 1938 03/23/17 0700  NA 131* 134* 139 143  K 2.8* 2.4* 3.5 3.4*  CL 94* 104 106 107  CO2 16* 22 26 27   GLUCOSE 126* 105* 104* 110*  BUN 20 9 <5* <5*  CREATININE 0.75 0.57 0.63 0.63  CALCIUM 11.3* 8.4* 8.7* 9.1  MG  --  1.6*  --   --    Liver Function Tests:  Recent Labs Lab 03/21/17 1243 03/22/17 0711  AST 62* 29  ALT 36 20  ALKPHOS 115 66  BILITOT 1.9* 0.9  PROT 9.2* 5.7*  ALBUMIN 5.8* 3.5     Recent Labs Lab 03/21/17 1243  LIPASE 43   CBC:  Recent Labs Lab 03/21/17 1243 03/22/17 0711  WBC 11.4* 6.6  NEUTROABS 9.4*  --   HGB 17.9* 12.8  HCT 47.8* 36.1  MCV 96.6 99.2  PLT 226 137*    Recent Labs  03/21/17 1243  TSH 1.787   Urinalysis    Component Value Date/Time  COLORURINE YELLOW 03/21/2017 1240   APPEARANCEUR HAZY (A) 03/21/2017 1240   LABSPEC 1.013 03/21/2017 1240   PHURINE 6.0 03/21/2017 1240   GLUCOSEU NEGATIVE 03/21/2017 1240   HGBUR MODERATE (A) 03/21/2017 1240   BILIRUBINUR NEGATIVE 03/21/2017 1240   KETONESUR 20 (A) 03/21/2017 1240   PROTEINUR 100 (A) 03/21/2017 1240   UROBILINOGEN 0.2 01/28/2010 2004   NITRITE POSITIVE (A) 03/21/2017 1240   LEUKOCYTESUR SMALL (A) 03/21/2017 1240   Microbiology Recent Results (from the past 240 hour(s))  Culture, Urine     Status: Abnormal (Preliminary result)   Collection Time: 03/21/17 12:40 PM  Result Value Ref Range Status   Specimen Description URINE, RANDOM  Final   Special Requests NONE  Final   Culture (A)  Final    >=100,000 COLONIES/mL ESCHERICHIA COLI SUSCEPTIBILITIES TO FOLLOW Performed at [facility], [address], [place], [place] [address]    Report Status PENDING  Incomplete    Time coordinating discharge: Over 30 minutes SIGNED:  Houston Siren, MD FACP [facility] 03/23/2017, 5:06 PM   If 7PM-7AM, please contact night-coverage [email or web address] Password [email or web address]

## 2017-03-24 LAB — URINE CULTURE

## 2017-03-25 LAB — GC/CHLAMYDIA PROBE AMP (~~LOC~~) NOT AT ARMC
Chlamydia: NEGATIVE
Neisseria Gonorrhea: NEGATIVE

## 2017-03-27 ENCOUNTER — Telehealth: Payer: Self-pay

## 2017-03-27 NOTE — Telephone Encounter (Signed)
LM requesting call back to confirm PCP and schedule hospital f/u.

## 2017-03-29 DIAGNOSIS — N390 Urinary tract infection, site not specified: Secondary | ICD-10-CM | POA: Diagnosis not present

## 2017-03-29 DIAGNOSIS — R319 Hematuria, unspecified: Secondary | ICD-10-CM | POA: Diagnosis not present

## 2017-03-29 DIAGNOSIS — R112 Nausea with vomiting, unspecified: Secondary | ICD-10-CM | POA: Diagnosis not present

## 2017-08-23 DIAGNOSIS — J0110 Acute frontal sinusitis, unspecified: Secondary | ICD-10-CM | POA: Diagnosis not present

## 2017-10-02 DIAGNOSIS — E785 Hyperlipidemia, unspecified: Secondary | ICD-10-CM | POA: Diagnosis not present

## 2017-10-02 DIAGNOSIS — Z0000 Encounter for general adult medical examination without abnormal findings: Secondary | ICD-10-CM | POA: Diagnosis not present

## 2017-10-02 DIAGNOSIS — L409 Psoriasis, unspecified: Secondary | ICD-10-CM | POA: Diagnosis not present

## 2017-10-02 DIAGNOSIS — I10 Essential (primary) hypertension: Secondary | ICD-10-CM | POA: Diagnosis not present

## 2017-10-02 DIAGNOSIS — F329 Major depressive disorder, single episode, unspecified: Secondary | ICD-10-CM | POA: Diagnosis not present

## 2017-10-03 ENCOUNTER — Other Ambulatory Visit (HOSPITAL_COMMUNITY): Payer: Self-pay | Admitting: Internal Medicine

## 2017-10-03 DIAGNOSIS — Z1231 Encounter for screening mammogram for malignant neoplasm of breast: Secondary | ICD-10-CM

## 2017-10-08 DIAGNOSIS — Z124 Encounter for screening for malignant neoplasm of cervix: Secondary | ICD-10-CM | POA: Diagnosis not present

## 2017-10-10 ENCOUNTER — Ambulatory Visit (HOSPITAL_COMMUNITY)
Admission: RE | Admit: 2017-10-10 | Discharge: 2017-10-10 | Disposition: A | Discharge status: Home / Self Care | Payer: BLUE CROSS/BLUE SHIELD | Source: Ambulatory Visit | Attending: Internal Medicine | Admitting: Internal Medicine

## 2017-10-10 DIAGNOSIS — Z1231 Encounter for screening mammogram for malignant neoplasm of breast: Secondary | ICD-10-CM

## 2017-12-13 DIAGNOSIS — M546 Pain in thoracic spine: Secondary | ICD-10-CM | POA: Diagnosis not present

## 2018-09-26 ENCOUNTER — Other Ambulatory Visit (HOSPITAL_COMMUNITY): Payer: Self-pay | Admitting: Internal Medicine

## 2018-09-26 DIAGNOSIS — Z1231 Encounter for screening mammogram for malignant neoplasm of breast: Secondary | ICD-10-CM

## 2018-10-15 ENCOUNTER — Ambulatory Visit (HOSPITAL_COMMUNITY)
Admission: RE | Admit: 2018-10-15 | Discharge: 2018-10-15 | Disposition: A | Discharge status: Home / Self Care | Payer: BLUE CROSS/BLUE SHIELD | Source: Ambulatory Visit | Attending: Internal Medicine | Admitting: Internal Medicine

## 2018-10-15 ENCOUNTER — Encounter (HOSPITAL_COMMUNITY): Payer: Self-pay

## 2018-10-15 DIAGNOSIS — Z1231 Encounter for screening mammogram for malignant neoplasm of breast: Secondary | ICD-10-CM

## 2018-10-15 DIAGNOSIS — M7541 Impingement syndrome of right shoulder: Secondary | ICD-10-CM | POA: Diagnosis not present

## 2018-10-21 DIAGNOSIS — M7541 Impingement syndrome of right shoulder: Secondary | ICD-10-CM | POA: Diagnosis not present

## 2018-10-21 DIAGNOSIS — M546 Pain in thoracic spine: Secondary | ICD-10-CM | POA: Diagnosis not present

## 2018-10-21 DIAGNOSIS — M2560 Stiffness of unspecified joint, not elsewhere classified: Secondary | ICD-10-CM | POA: Diagnosis not present

## 2018-10-21 DIAGNOSIS — M542 Cervicalgia: Secondary | ICD-10-CM | POA: Diagnosis not present

## 2018-10-23 DIAGNOSIS — M2560 Stiffness of unspecified joint, not elsewhere classified: Secondary | ICD-10-CM | POA: Diagnosis not present

## 2018-10-23 DIAGNOSIS — M542 Cervicalgia: Secondary | ICD-10-CM | POA: Diagnosis not present

## 2018-10-23 DIAGNOSIS — M546 Pain in thoracic spine: Secondary | ICD-10-CM | POA: Diagnosis not present

## 2018-10-23 DIAGNOSIS — M7541 Impingement syndrome of right shoulder: Secondary | ICD-10-CM | POA: Diagnosis not present

## 2018-10-24 DIAGNOSIS — M546 Pain in thoracic spine: Secondary | ICD-10-CM | POA: Diagnosis not present

## 2018-10-24 DIAGNOSIS — M7541 Impingement syndrome of right shoulder: Secondary | ICD-10-CM | POA: Diagnosis not present

## 2018-10-24 DIAGNOSIS — M542 Cervicalgia: Secondary | ICD-10-CM | POA: Diagnosis not present

## 2018-10-24 DIAGNOSIS — M2560 Stiffness of unspecified joint, not elsewhere classified: Secondary | ICD-10-CM | POA: Diagnosis not present

## 2018-10-28 DIAGNOSIS — M2560 Stiffness of unspecified joint, not elsewhere classified: Secondary | ICD-10-CM | POA: Diagnosis not present

## 2018-10-28 DIAGNOSIS — M546 Pain in thoracic spine: Secondary | ICD-10-CM | POA: Diagnosis not present

## 2018-10-28 DIAGNOSIS — M542 Cervicalgia: Secondary | ICD-10-CM | POA: Diagnosis not present

## 2018-10-28 DIAGNOSIS — M7541 Impingement syndrome of right shoulder: Secondary | ICD-10-CM | POA: Diagnosis not present

## 2018-10-29 DIAGNOSIS — M546 Pain in thoracic spine: Secondary | ICD-10-CM | POA: Diagnosis not present

## 2018-10-29 DIAGNOSIS — M542 Cervicalgia: Secondary | ICD-10-CM | POA: Diagnosis not present

## 2018-10-29 DIAGNOSIS — M2560 Stiffness of unspecified joint, not elsewhere classified: Secondary | ICD-10-CM | POA: Diagnosis not present

## 2018-10-29 DIAGNOSIS — M7541 Impingement syndrome of right shoulder: Secondary | ICD-10-CM | POA: Diagnosis not present

## 2018-10-30 DIAGNOSIS — M7541 Impingement syndrome of right shoulder: Secondary | ICD-10-CM | POA: Diagnosis not present

## 2018-10-30 DIAGNOSIS — M542 Cervicalgia: Secondary | ICD-10-CM | POA: Diagnosis not present

## 2018-10-30 DIAGNOSIS — M2560 Stiffness of unspecified joint, not elsewhere classified: Secondary | ICD-10-CM | POA: Diagnosis not present

## 2018-10-30 DIAGNOSIS — M546 Pain in thoracic spine: Secondary | ICD-10-CM | POA: Diagnosis not present

## 2018-11-03 DIAGNOSIS — M7541 Impingement syndrome of right shoulder: Secondary | ICD-10-CM | POA: Diagnosis not present

## 2018-11-03 DIAGNOSIS — M542 Cervicalgia: Secondary | ICD-10-CM | POA: Diagnosis not present

## 2018-11-03 DIAGNOSIS — M546 Pain in thoracic spine: Secondary | ICD-10-CM | POA: Diagnosis not present

## 2018-11-03 DIAGNOSIS — M2560 Stiffness of unspecified joint, not elsewhere classified: Secondary | ICD-10-CM | POA: Diagnosis not present

## 2018-11-04 DIAGNOSIS — M7541 Impingement syndrome of right shoulder: Secondary | ICD-10-CM | POA: Diagnosis not present

## 2018-11-04 DIAGNOSIS — M2560 Stiffness of unspecified joint, not elsewhere classified: Secondary | ICD-10-CM | POA: Diagnosis not present

## 2018-11-04 DIAGNOSIS — M546 Pain in thoracic spine: Secondary | ICD-10-CM | POA: Diagnosis not present

## 2018-11-04 DIAGNOSIS — M542 Cervicalgia: Secondary | ICD-10-CM | POA: Diagnosis not present

## 2018-11-05 DIAGNOSIS — M542 Cervicalgia: Secondary | ICD-10-CM | POA: Diagnosis not present

## 2018-11-05 DIAGNOSIS — M7541 Impingement syndrome of right shoulder: Secondary | ICD-10-CM | POA: Diagnosis not present

## 2018-11-05 DIAGNOSIS — M546 Pain in thoracic spine: Secondary | ICD-10-CM | POA: Diagnosis not present

## 2018-11-05 DIAGNOSIS — M2560 Stiffness of unspecified joint, not elsewhere classified: Secondary | ICD-10-CM | POA: Diagnosis not present

## 2018-11-11 DIAGNOSIS — M546 Pain in thoracic spine: Secondary | ICD-10-CM | POA: Diagnosis not present

## 2018-11-11 DIAGNOSIS — M2560 Stiffness of unspecified joint, not elsewhere classified: Secondary | ICD-10-CM | POA: Diagnosis not present

## 2018-11-11 DIAGNOSIS — M7541 Impingement syndrome of right shoulder: Secondary | ICD-10-CM | POA: Diagnosis not present

## 2018-11-11 DIAGNOSIS — M542 Cervicalgia: Secondary | ICD-10-CM | POA: Diagnosis not present

## 2018-11-14 DIAGNOSIS — M542 Cervicalgia: Secondary | ICD-10-CM | POA: Diagnosis not present

## 2018-11-14 DIAGNOSIS — M2560 Stiffness of unspecified joint, not elsewhere classified: Secondary | ICD-10-CM | POA: Diagnosis not present

## 2018-11-14 DIAGNOSIS — M7541 Impingement syndrome of right shoulder: Secondary | ICD-10-CM | POA: Diagnosis not present

## 2018-11-14 DIAGNOSIS — M546 Pain in thoracic spine: Secondary | ICD-10-CM | POA: Diagnosis not present

## 2018-11-19 DIAGNOSIS — M7541 Impingement syndrome of right shoulder: Secondary | ICD-10-CM | POA: Diagnosis not present

## 2018-11-19 DIAGNOSIS — Z131 Encounter for screening for diabetes mellitus: Secondary | ICD-10-CM | POA: Diagnosis not present

## 2018-11-19 DIAGNOSIS — I10 Essential (primary) hypertension: Secondary | ICD-10-CM | POA: Diagnosis not present

## 2018-11-19 DIAGNOSIS — M546 Pain in thoracic spine: Secondary | ICD-10-CM | POA: Diagnosis not present

## 2018-11-19 DIAGNOSIS — Z13220 Encounter for screening for lipoid disorders: Secondary | ICD-10-CM | POA: Diagnosis not present

## 2018-11-19 DIAGNOSIS — M542 Cervicalgia: Secondary | ICD-10-CM | POA: Diagnosis not present

## 2018-11-19 DIAGNOSIS — E785 Hyperlipidemia, unspecified: Secondary | ICD-10-CM | POA: Diagnosis not present

## 2018-11-19 DIAGNOSIS — Z0000 Encounter for general adult medical examination without abnormal findings: Secondary | ICD-10-CM | POA: Diagnosis not present

## 2018-11-19 DIAGNOSIS — M2560 Stiffness of unspecified joint, not elsewhere classified: Secondary | ICD-10-CM | POA: Diagnosis not present

## 2018-11-20 DIAGNOSIS — M7541 Impingement syndrome of right shoulder: Secondary | ICD-10-CM | POA: Diagnosis not present

## 2018-11-20 DIAGNOSIS — M2560 Stiffness of unspecified joint, not elsewhere classified: Secondary | ICD-10-CM | POA: Diagnosis not present

## 2018-11-20 DIAGNOSIS — M546 Pain in thoracic spine: Secondary | ICD-10-CM | POA: Diagnosis not present

## 2018-11-20 DIAGNOSIS — M542 Cervicalgia: Secondary | ICD-10-CM | POA: Diagnosis not present

## 2019-09-21 ENCOUNTER — Other Ambulatory Visit (HOSPITAL_COMMUNITY): Payer: Self-pay | Admitting: Family Medicine

## 2019-09-21 DIAGNOSIS — Z1231 Encounter for screening mammogram for malignant neoplasm of breast: Secondary | ICD-10-CM

## 2019-10-19 ENCOUNTER — Other Ambulatory Visit: Payer: Self-pay

## 2019-10-19 ENCOUNTER — Ambulatory Visit (HOSPITAL_COMMUNITY)
Admission: RE | Admit: 2019-10-19 | Discharge: 2019-10-19 | Disposition: A | Discharge status: Home / Self Care | Payer: BC Managed Care – PPO | Source: Ambulatory Visit | Attending: Family Medicine | Admitting: Family Medicine

## 2019-10-19 DIAGNOSIS — Z1231 Encounter for screening mammogram for malignant neoplasm of breast: Secondary | ICD-10-CM

## 2019-11-23 DIAGNOSIS — Z0000 Encounter for general adult medical examination without abnormal findings: Secondary | ICD-10-CM | POA: Diagnosis not present

## 2019-11-23 DIAGNOSIS — Z23 Encounter for immunization: Secondary | ICD-10-CM | POA: Diagnosis not present

## 2019-11-23 DIAGNOSIS — E782 Mixed hyperlipidemia: Secondary | ICD-10-CM | POA: Diagnosis not present

## 2019-11-23 DIAGNOSIS — I10 Essential (primary) hypertension: Secondary | ICD-10-CM | POA: Diagnosis not present

## 2019-12-19 DIAGNOSIS — K529 Noninfective gastroenteritis and colitis, unspecified: Secondary | ICD-10-CM | POA: Diagnosis not present

## 2019-12-19 DIAGNOSIS — R112 Nausea with vomiting, unspecified: Secondary | ICD-10-CM | POA: Diagnosis not present

## 2019-12-19 DIAGNOSIS — G44209 Tension-type headache, unspecified, not intractable: Secondary | ICD-10-CM | POA: Diagnosis not present

## 2019-12-19 DIAGNOSIS — H6092 Unspecified otitis externa, left ear: Secondary | ICD-10-CM | POA: Diagnosis not present

## 2020-01-26 DIAGNOSIS — Z23 Encounter for immunization: Secondary | ICD-10-CM | POA: Diagnosis not present

## 2020-04-04 ENCOUNTER — Other Ambulatory Visit: Payer: Self-pay | Admitting: Family Medicine

## 2020-04-04 ENCOUNTER — Ambulatory Visit
Admission: RE | Admit: 2020-04-04 | Discharge: 2020-04-04 | Disposition: A | Discharge status: Home / Self Care | Payer: BC Managed Care – PPO | Source: Ambulatory Visit | Attending: Family Medicine | Admitting: Family Medicine

## 2020-04-04 ENCOUNTER — Other Ambulatory Visit: Payer: Self-pay

## 2020-04-04 DIAGNOSIS — M2540 Effusion, unspecified joint: Secondary | ICD-10-CM

## 2020-04-04 DIAGNOSIS — G8929 Other chronic pain: Secondary | ICD-10-CM | POA: Diagnosis not present

## 2020-04-04 DIAGNOSIS — M2550 Pain in unspecified joint: Secondary | ICD-10-CM | POA: Diagnosis not present

## 2020-04-04 DIAGNOSIS — M79642 Pain in left hand: Secondary | ICD-10-CM

## 2020-04-04 DIAGNOSIS — M19041 Primary osteoarthritis, right hand: Secondary | ICD-10-CM | POA: Diagnosis not present

## 2020-04-04 DIAGNOSIS — M79641 Pain in right hand: Secondary | ICD-10-CM

## 2020-04-04 DIAGNOSIS — M19042 Primary osteoarthritis, left hand: Secondary | ICD-10-CM | POA: Diagnosis not present

## 2020-04-04 DIAGNOSIS — M109 Gout, unspecified: Secondary | ICD-10-CM | POA: Diagnosis not present

## 2020-04-04 DIAGNOSIS — G4700 Insomnia, unspecified: Secondary | ICD-10-CM | POA: Diagnosis not present

## 2020-04-07 DIAGNOSIS — M71449 Calcium deposit in bursa, unspecified hand: Secondary | ICD-10-CM | POA: Diagnosis not present

## 2020-04-28 DIAGNOSIS — R5382 Chronic fatigue, unspecified: Secondary | ICD-10-CM | POA: Diagnosis not present

## 2020-04-28 DIAGNOSIS — E8881 Metabolic syndrome: Secondary | ICD-10-CM | POA: Diagnosis not present

## 2020-04-28 DIAGNOSIS — M359 Systemic involvement of connective tissue, unspecified: Secondary | ICD-10-CM | POA: Diagnosis not present

## 2020-04-28 DIAGNOSIS — E349 Endocrine disorder, unspecified: Secondary | ICD-10-CM | POA: Diagnosis not present

## 2020-04-28 DIAGNOSIS — R799 Abnormal finding of blood chemistry, unspecified: Secondary | ICD-10-CM | POA: Diagnosis not present

## 2020-04-28 DIAGNOSIS — E079 Disorder of thyroid, unspecified: Secondary | ICD-10-CM | POA: Diagnosis not present

## 2020-07-26 DIAGNOSIS — E559 Vitamin D deficiency, unspecified: Secondary | ICD-10-CM | POA: Diagnosis not present

## 2020-07-26 DIAGNOSIS — D510 Vitamin B12 deficiency anemia due to intrinsic factor deficiency: Secondary | ICD-10-CM | POA: Diagnosis not present

## 2020-07-26 DIAGNOSIS — Z79890 Hormone replacement therapy (postmenopausal): Secondary | ICD-10-CM | POA: Diagnosis not present

## 2020-07-26 DIAGNOSIS — E8881 Metabolic syndrome: Secondary | ICD-10-CM | POA: Diagnosis not present

## 2020-09-12 DIAGNOSIS — R030 Elevated blood-pressure reading, without diagnosis of hypertension: Secondary | ICD-10-CM | POA: Diagnosis not present

## 2020-10-25 DIAGNOSIS — F4320 Adjustment disorder, unspecified: Secondary | ICD-10-CM | POA: Diagnosis not present

## 2020-10-26 DIAGNOSIS — Z79890 Hormone replacement therapy (postmenopausal): Secondary | ICD-10-CM | POA: Diagnosis not present

## 2020-10-26 DIAGNOSIS — E8881 Metabolic syndrome: Secondary | ICD-10-CM | POA: Diagnosis not present

## 2020-10-26 DIAGNOSIS — D510 Vitamin B12 deficiency anemia due to intrinsic factor deficiency: Secondary | ICD-10-CM | POA: Diagnosis not present

## 2020-10-26 DIAGNOSIS — E559 Vitamin D deficiency, unspecified: Secondary | ICD-10-CM | POA: Diagnosis not present

## 2020-12-06 DIAGNOSIS — E785 Hyperlipidemia, unspecified: Secondary | ICD-10-CM | POA: Diagnosis not present

## 2020-12-06 DIAGNOSIS — Z0000 Encounter for general adult medical examination without abnormal findings: Secondary | ICD-10-CM | POA: Diagnosis not present

## 2020-12-07 ENCOUNTER — Other Ambulatory Visit (HOSPITAL_COMMUNITY): Payer: Self-pay | Admitting: Family Medicine

## 2020-12-07 DIAGNOSIS — Z1231 Encounter for screening mammogram for malignant neoplasm of breast: Secondary | ICD-10-CM

## 2020-12-13 DIAGNOSIS — R140 Abdominal distension (gaseous): Secondary | ICD-10-CM | POA: Diagnosis not present

## 2020-12-13 DIAGNOSIS — Z1211 Encounter for screening for malignant neoplasm of colon: Secondary | ICD-10-CM | POA: Diagnosis not present

## 2020-12-19 ENCOUNTER — Ambulatory Visit (HOSPITAL_COMMUNITY)
Admission: RE | Admit: 2020-12-19 | Discharge: 2020-12-19 | Disposition: A | Discharge status: Home / Self Care | Payer: BC Managed Care – PPO | Source: Ambulatory Visit | Attending: Family Medicine | Admitting: Family Medicine

## 2020-12-19 DIAGNOSIS — Z1231 Encounter for screening mammogram for malignant neoplasm of breast: Secondary | ICD-10-CM

## 2020-12-26 DIAGNOSIS — Z79890 Hormone replacement therapy (postmenopausal): Secondary | ICD-10-CM | POA: Diagnosis not present

## 2020-12-26 DIAGNOSIS — F419 Anxiety disorder, unspecified: Secondary | ICD-10-CM | POA: Diagnosis not present

## 2020-12-26 DIAGNOSIS — Z719 Counseling, unspecified: Secondary | ICD-10-CM | POA: Diagnosis not present

## 2020-12-26 DIAGNOSIS — I10 Essential (primary) hypertension: Secondary | ICD-10-CM | POA: Diagnosis not present

## 2021-01-10 DIAGNOSIS — G4700 Insomnia, unspecified: Secondary | ICD-10-CM | POA: Diagnosis not present

## 2021-01-10 DIAGNOSIS — I10 Essential (primary) hypertension: Secondary | ICD-10-CM | POA: Diagnosis not present

## 2021-01-10 DIAGNOSIS — F329 Major depressive disorder, single episode, unspecified: Secondary | ICD-10-CM | POA: Diagnosis not present

## 2021-01-17 DIAGNOSIS — F4320 Adjustment disorder, unspecified: Secondary | ICD-10-CM | POA: Diagnosis not present

## 2021-01-24 DIAGNOSIS — F4320 Adjustment disorder, unspecified: Secondary | ICD-10-CM | POA: Diagnosis not present

## 2021-01-31 DIAGNOSIS — F4320 Adjustment disorder, unspecified: Secondary | ICD-10-CM | POA: Diagnosis not present

## 2021-02-06 DIAGNOSIS — K5730 Diverticulosis of large intestine without perforation or abscess without bleeding: Secondary | ICD-10-CM | POA: Diagnosis not present

## 2021-02-06 DIAGNOSIS — Z1211 Encounter for screening for malignant neoplasm of colon: Secondary | ICD-10-CM | POA: Diagnosis not present

## 2021-02-07 DIAGNOSIS — F4320 Adjustment disorder, unspecified: Secondary | ICD-10-CM | POA: Diagnosis not present

## 2021-02-10 DIAGNOSIS — I10 Essential (primary) hypertension: Secondary | ICD-10-CM | POA: Diagnosis not present

## 2021-02-10 DIAGNOSIS — G4700 Insomnia, unspecified: Secondary | ICD-10-CM | POA: Diagnosis not present

## 2021-02-14 DIAGNOSIS — F4320 Adjustment disorder, unspecified: Secondary | ICD-10-CM | POA: Diagnosis not present

## 2021-03-15 DIAGNOSIS — I10 Essential (primary) hypertension: Secondary | ICD-10-CM | POA: Diagnosis not present

## 2021-04-22 DIAGNOSIS — H6501 Acute serous otitis media, right ear: Secondary | ICD-10-CM | POA: Diagnosis not present

## 2021-06-13 DIAGNOSIS — H9203 Otalgia, bilateral: Secondary | ICD-10-CM | POA: Diagnosis not present

## 2021-06-13 DIAGNOSIS — I10 Essential (primary) hypertension: Secondary | ICD-10-CM | POA: Diagnosis not present

## 2021-06-13 DIAGNOSIS — F329 Major depressive disorder, single episode, unspecified: Secondary | ICD-10-CM | POA: Diagnosis not present

## 2021-06-26 ENCOUNTER — Other Ambulatory Visit: Payer: Self-pay | Admitting: Family Medicine

## 2021-06-26 ENCOUNTER — Other Ambulatory Visit: Payer: Self-pay

## 2021-06-26 ENCOUNTER — Ambulatory Visit
Admission: RE | Admit: 2021-06-26 | Discharge: 2021-06-26 | Disposition: A | Discharge status: Home / Self Care | Payer: BC Managed Care – PPO | Source: Ambulatory Visit | Attending: Family Medicine | Admitting: Family Medicine

## 2021-06-26 DIAGNOSIS — R0981 Nasal congestion: Secondary | ICD-10-CM

## 2021-06-26 DIAGNOSIS — H9203 Otalgia, bilateral: Secondary | ICD-10-CM | POA: Diagnosis not present

## 2021-11-24 DIAGNOSIS — F418 Other specified anxiety disorders: Secondary | ICD-10-CM | POA: Diagnosis not present

## 2021-11-24 DIAGNOSIS — I10 Essential (primary) hypertension: Secondary | ICD-10-CM | POA: Diagnosis not present

## 2021-11-24 DIAGNOSIS — R0981 Nasal congestion: Secondary | ICD-10-CM | POA: Diagnosis not present

## 2021-11-30 ENCOUNTER — Other Ambulatory Visit (HOSPITAL_COMMUNITY): Payer: Self-pay | Admitting: Family Medicine

## 2021-12-21 ENCOUNTER — Ambulatory Visit (HOSPITAL_COMMUNITY)
Admission: RE | Admit: 2021-12-21 | Discharge: 2021-12-21 | Disposition: A | Discharge status: Home / Self Care | Payer: BC Managed Care – PPO | Source: Ambulatory Visit | Attending: Family Medicine | Admitting: Family Medicine

## 2021-12-21 DIAGNOSIS — Z1231 Encounter for screening mammogram for malignant neoplasm of breast: Secondary | ICD-10-CM | POA: Insufficient documentation

## 2021-12-25 ENCOUNTER — Other Ambulatory Visit (HOSPITAL_COMMUNITY): Payer: Self-pay | Admitting: Family Medicine

## 2021-12-27 DIAGNOSIS — Z0000 Encounter for general adult medical examination without abnormal findings: Secondary | ICD-10-CM | POA: Diagnosis not present

## 2021-12-27 DIAGNOSIS — E785 Hyperlipidemia, unspecified: Secondary | ICD-10-CM | POA: Diagnosis not present

## 2021-12-28 ENCOUNTER — Other Ambulatory Visit (HOSPITAL_COMMUNITY): Payer: Self-pay | Admitting: Family Medicine

## 2021-12-28 DIAGNOSIS — R928 Other abnormal and inconclusive findings on diagnostic imaging of breast: Secondary | ICD-10-CM

## 2022-01-17 ENCOUNTER — Ambulatory Visit (HOSPITAL_COMMUNITY)
Admission: RE | Admit: 2022-01-17 | Discharge: 2022-01-17 | Disposition: A | Discharge status: Home / Self Care | Payer: BC Managed Care – PPO | Source: Ambulatory Visit | Attending: Family Medicine | Admitting: Family Medicine

## 2022-01-17 DIAGNOSIS — N6012 Diffuse cystic mastopathy of left breast: Secondary | ICD-10-CM | POA: Diagnosis not present

## 2022-01-17 DIAGNOSIS — R928 Other abnormal and inconclusive findings on diagnostic imaging of breast: Secondary | ICD-10-CM

## 2022-01-17 DIAGNOSIS — N6011 Diffuse cystic mastopathy of right breast: Secondary | ICD-10-CM | POA: Diagnosis not present

## 2022-02-01 IMAGING — CR DG HAND COMPLETE 3+V*L*
3 series · 3 of 3 positions shown · non-contrast
Comparison: None.

CLINICAL DATA: Left hand pain and swelling for several years,
initial encounter

EXAM:
LEFT HAND - COMPLETE 3+ VIEW

[x hand pa left]
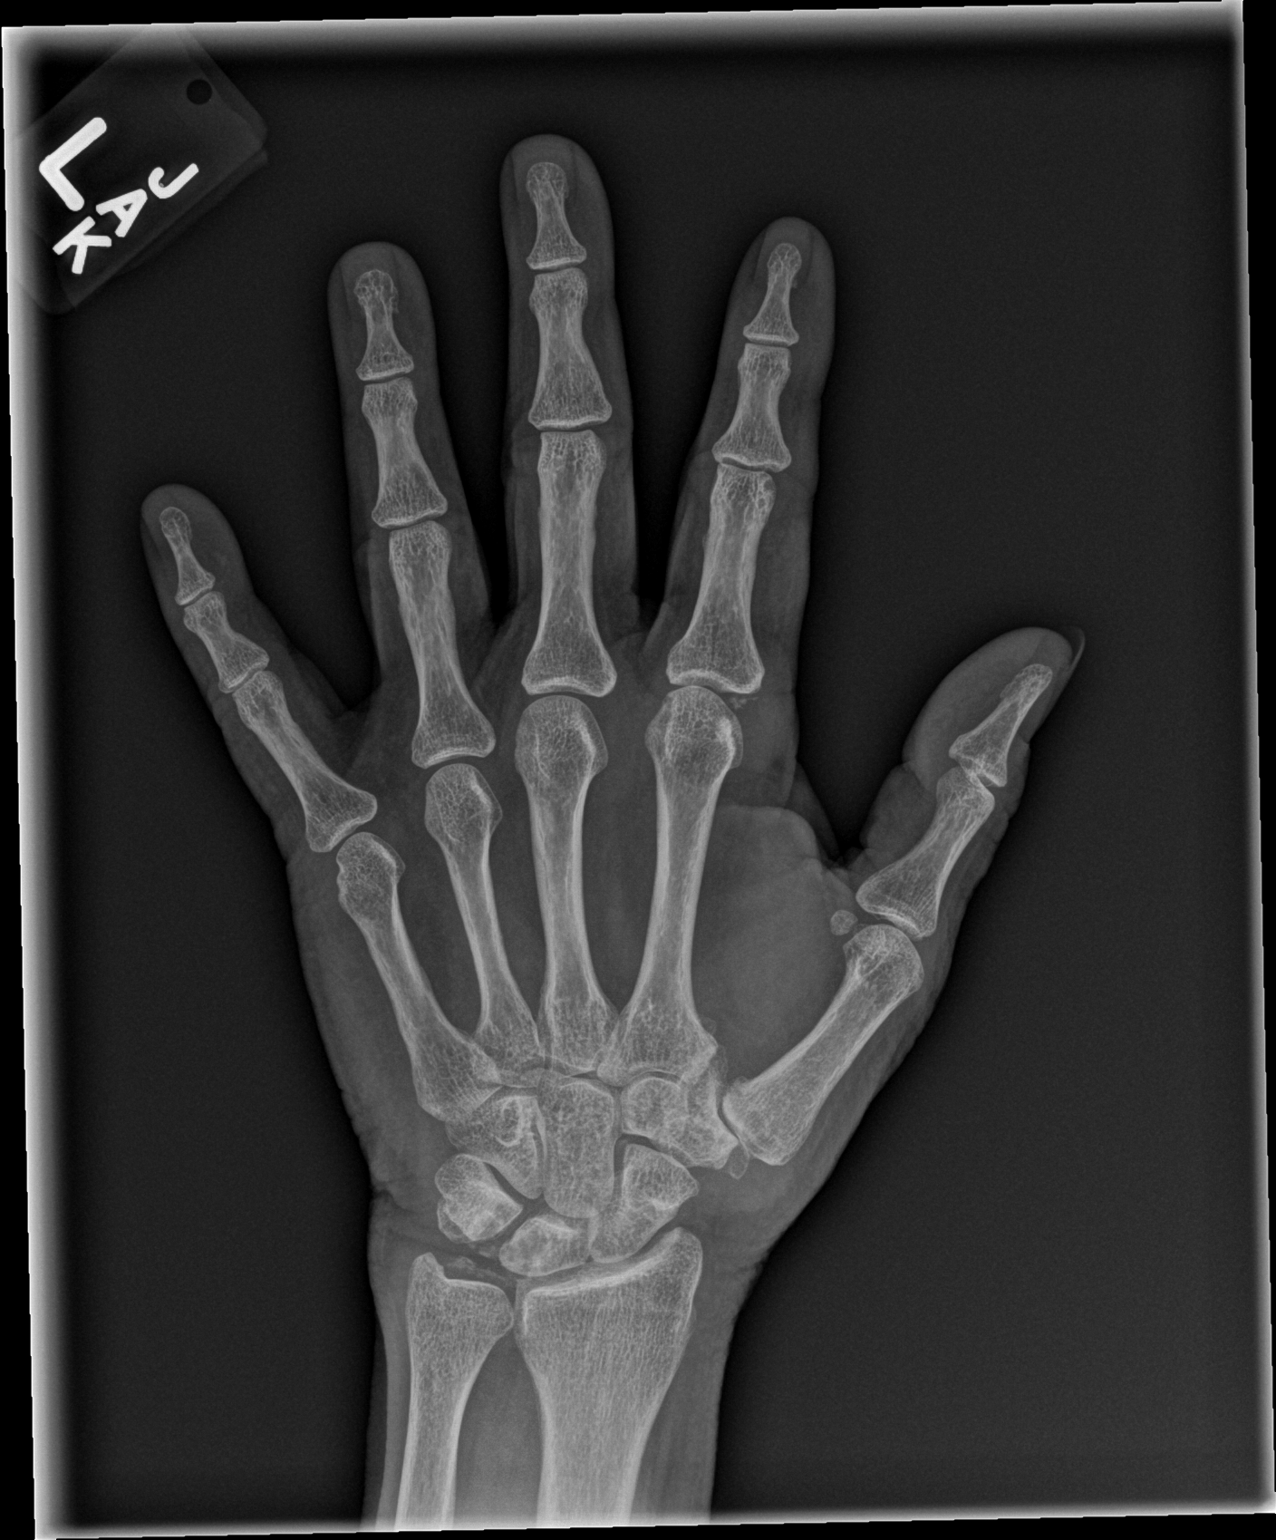

[x hand obl left]
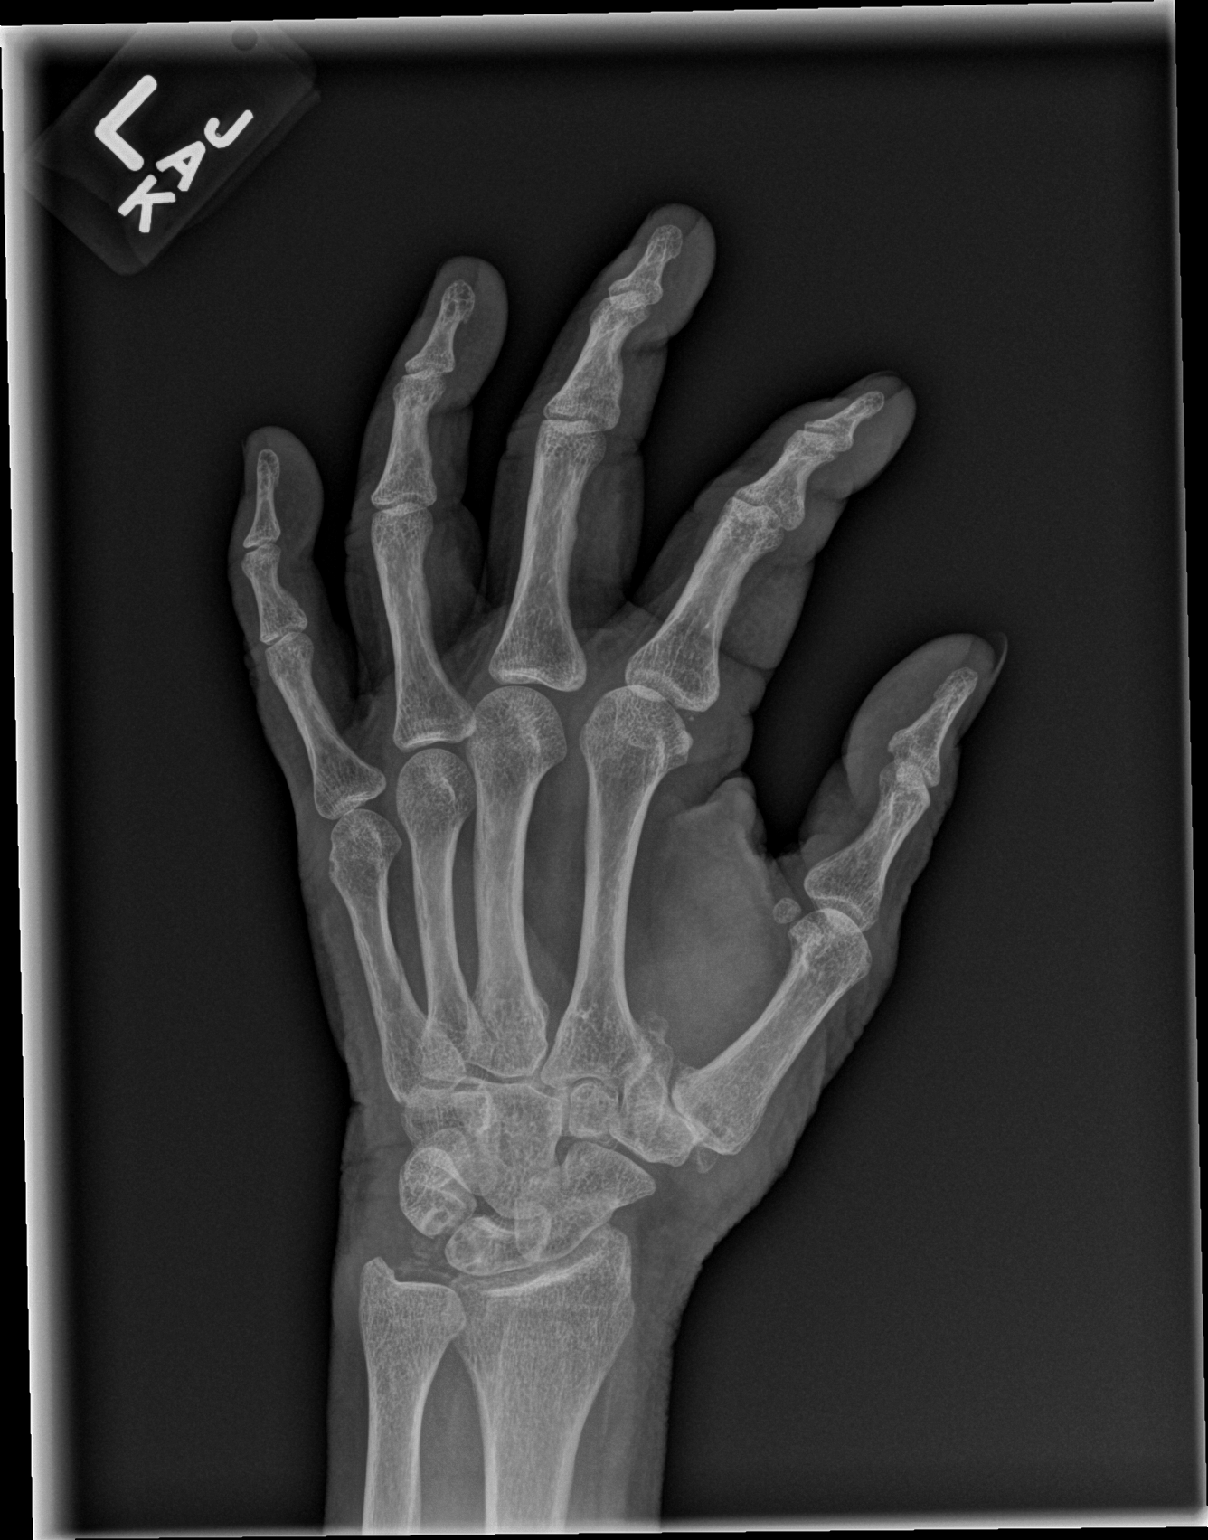

[x hand lat left]
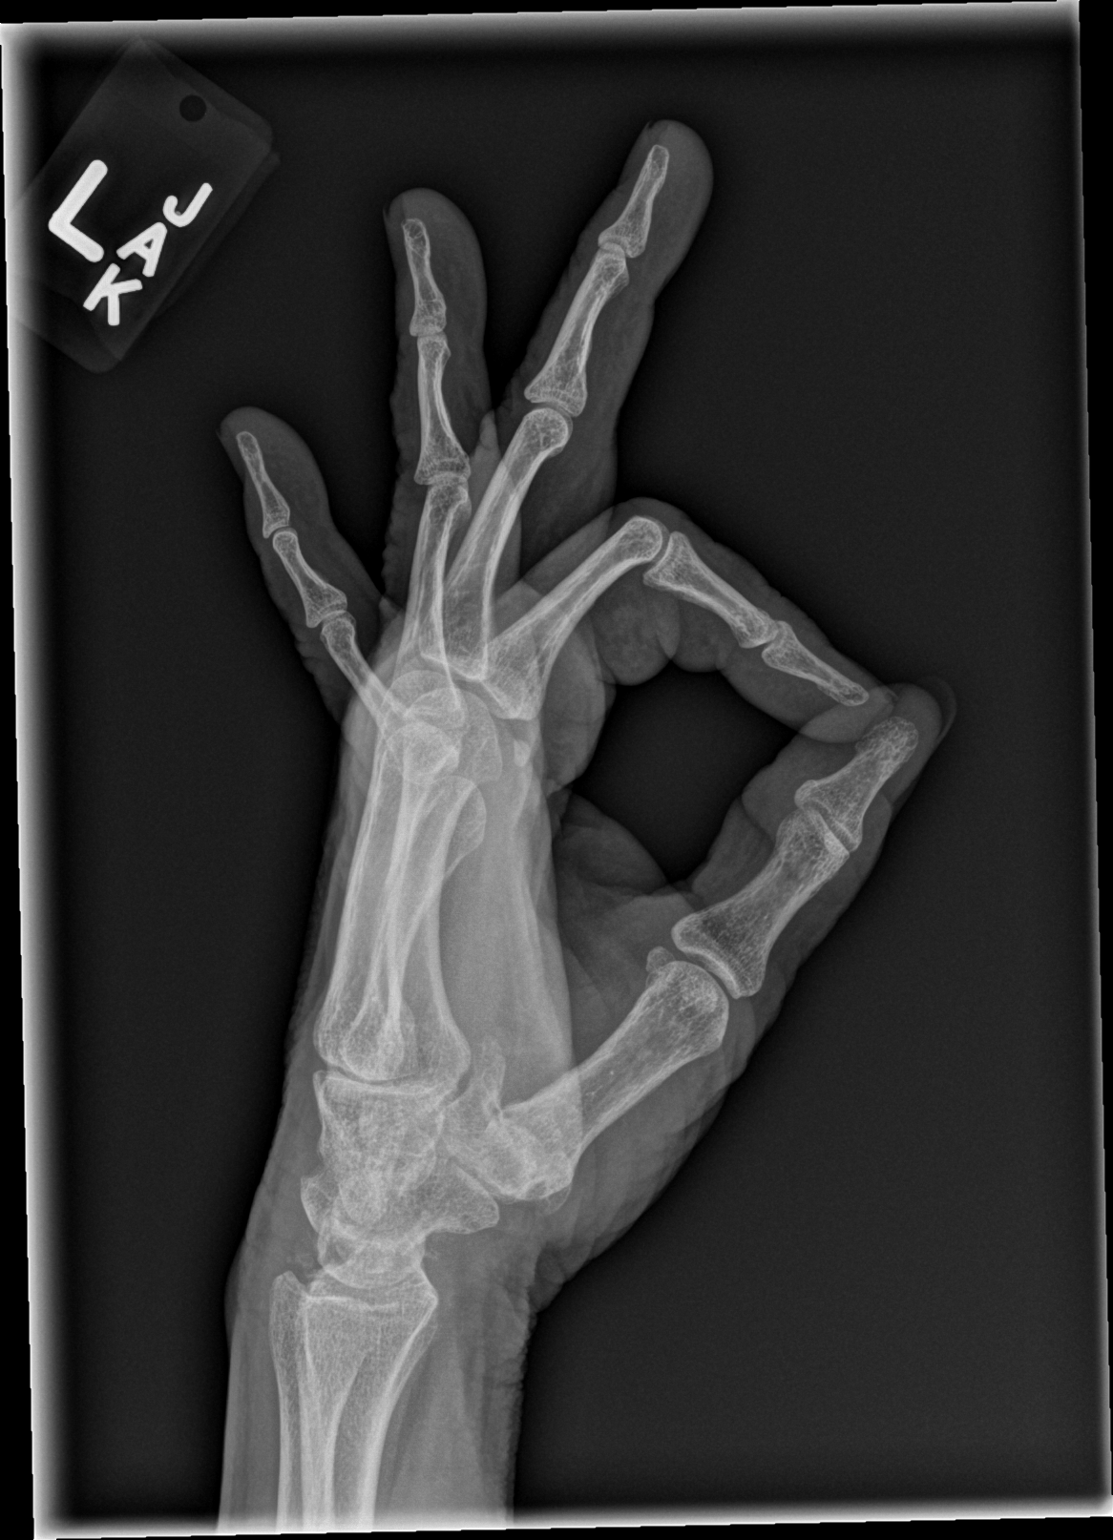

[3 of 3 positions shown; findings below may reference images not displayed]

FINDINGS: Mild degenerative changes are noted in the carpal bones with some
soft tissue calcification in the ulnar carpal joint. No acute
fracture or dislocation is noted. Degenerative changes at the first
CMC joint are seen. Calcifications are noted adjacent to the second
MCP joint could represent some underlying gout. No other focal
abnormality is noted.
IMPRESSION: Calcifications at the second MCP joint. Underlying gout could not
excluded.

Scattered degenerative changes are seen. No acute bony abnormality
is noted.

## 2022-02-01 IMAGING — CR DG HAND COMPLETE 3+V*R*
3 series · 3 of 3 positions shown · non-contrast
Comparison: None.

CLINICAL DATA: Chronic hand pain and swelling, initial encounter

EXAM:
RIGHT HAND - COMPLETE 3+ VIEW

[x hand pa right]
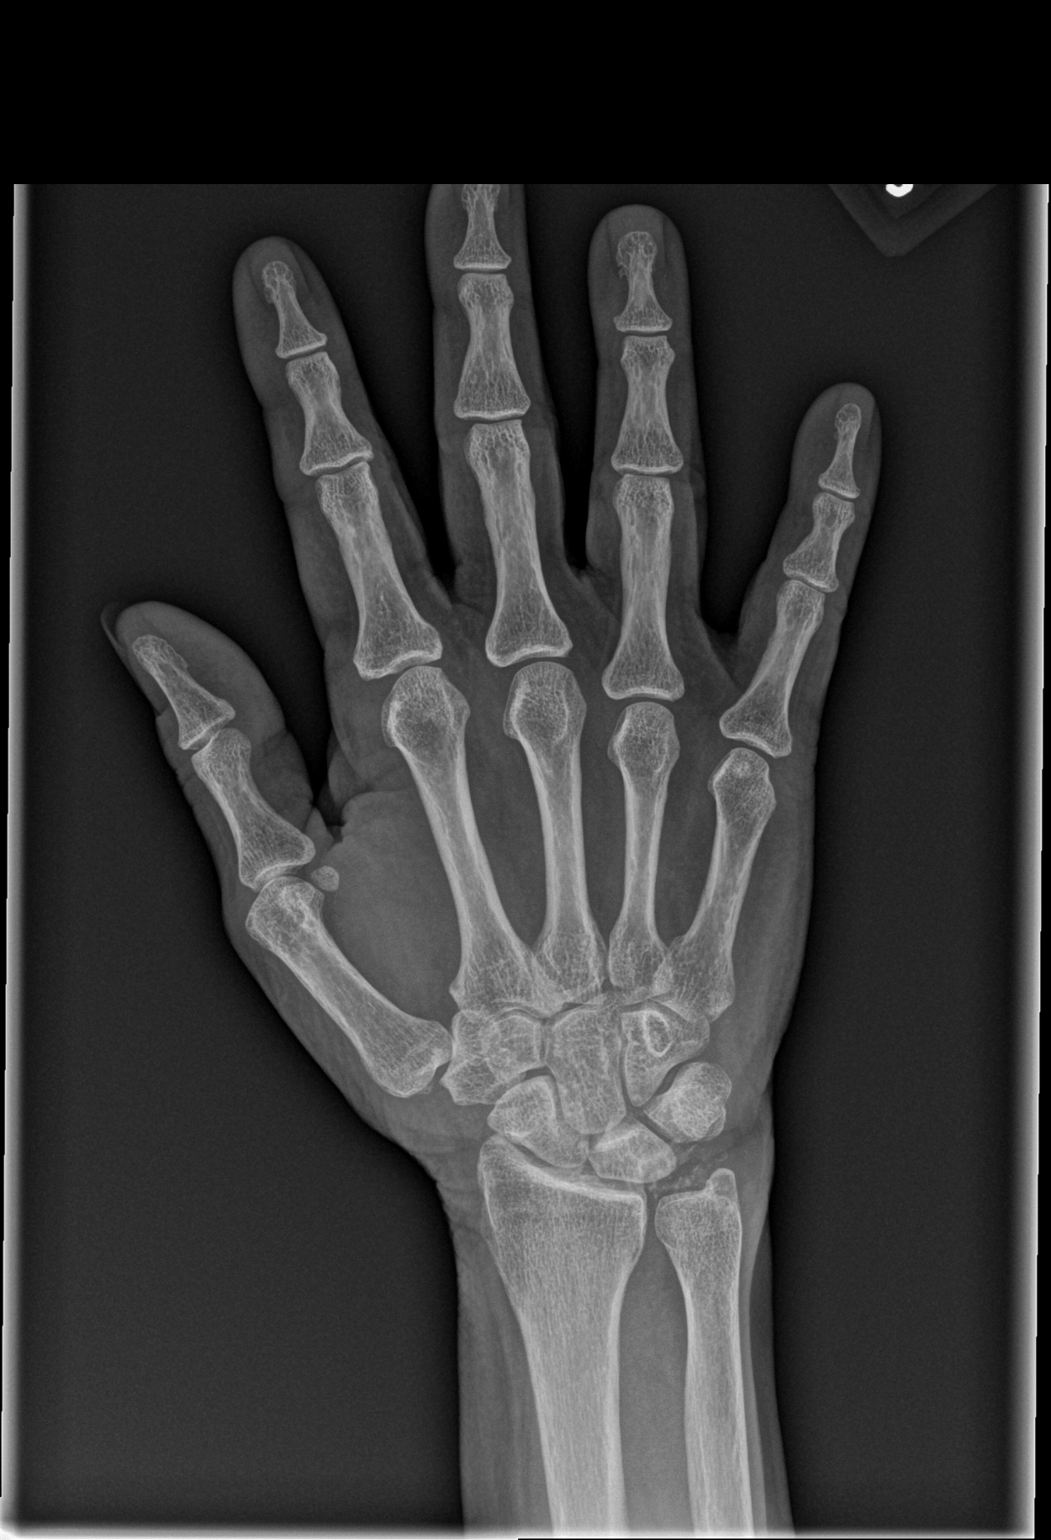

[x hand obl right]
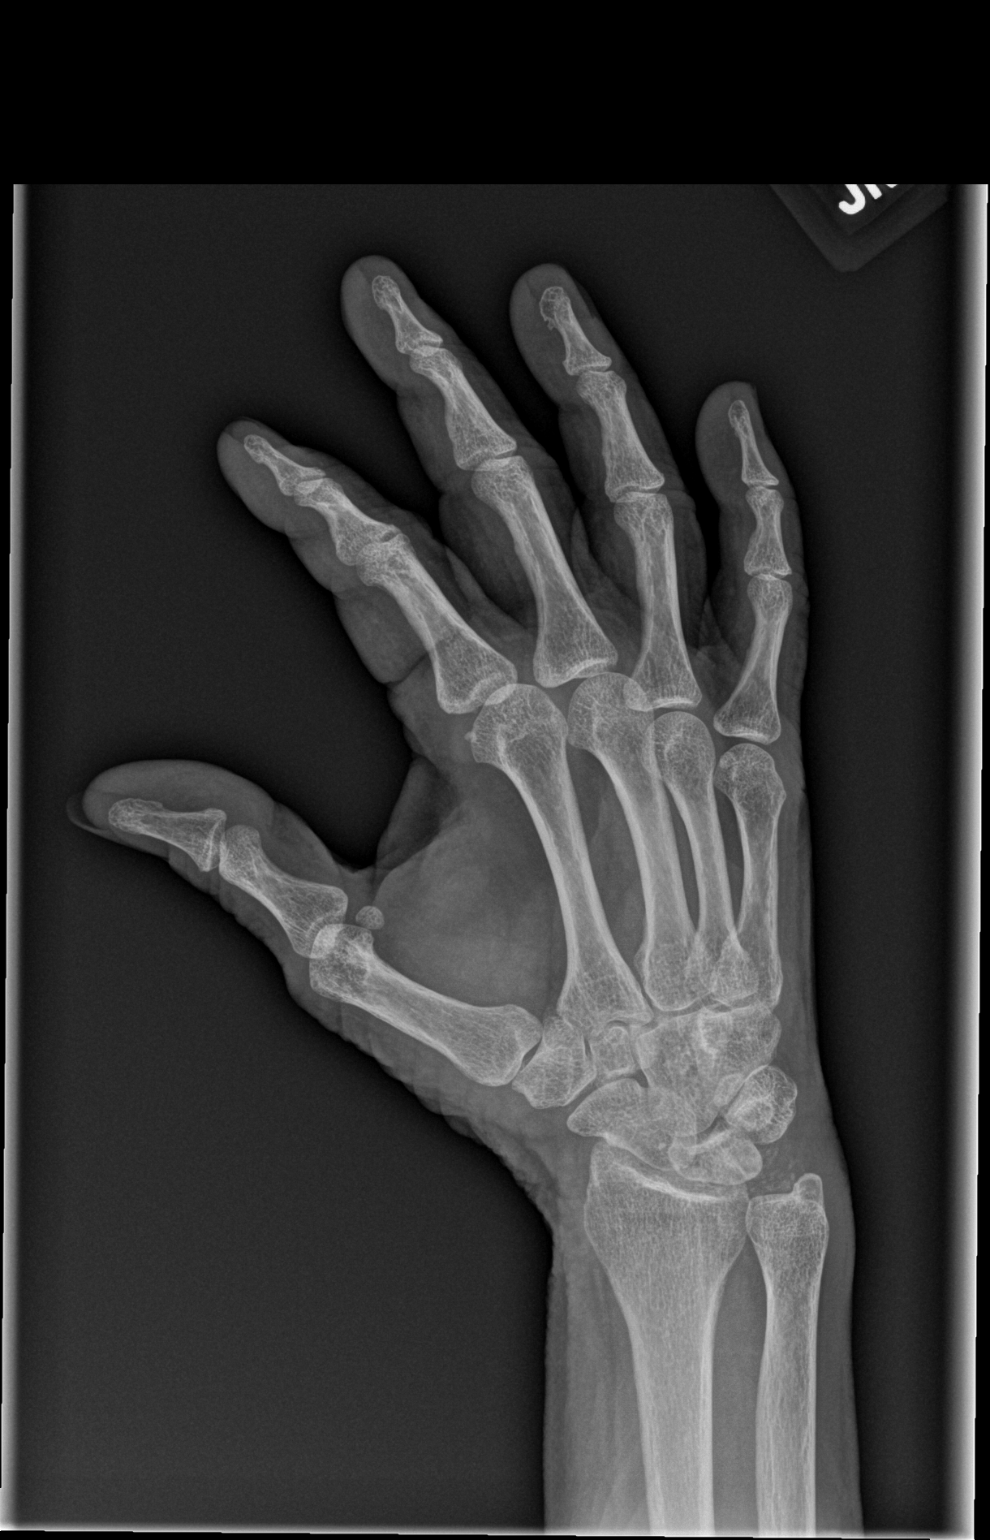

[x hand lat right]
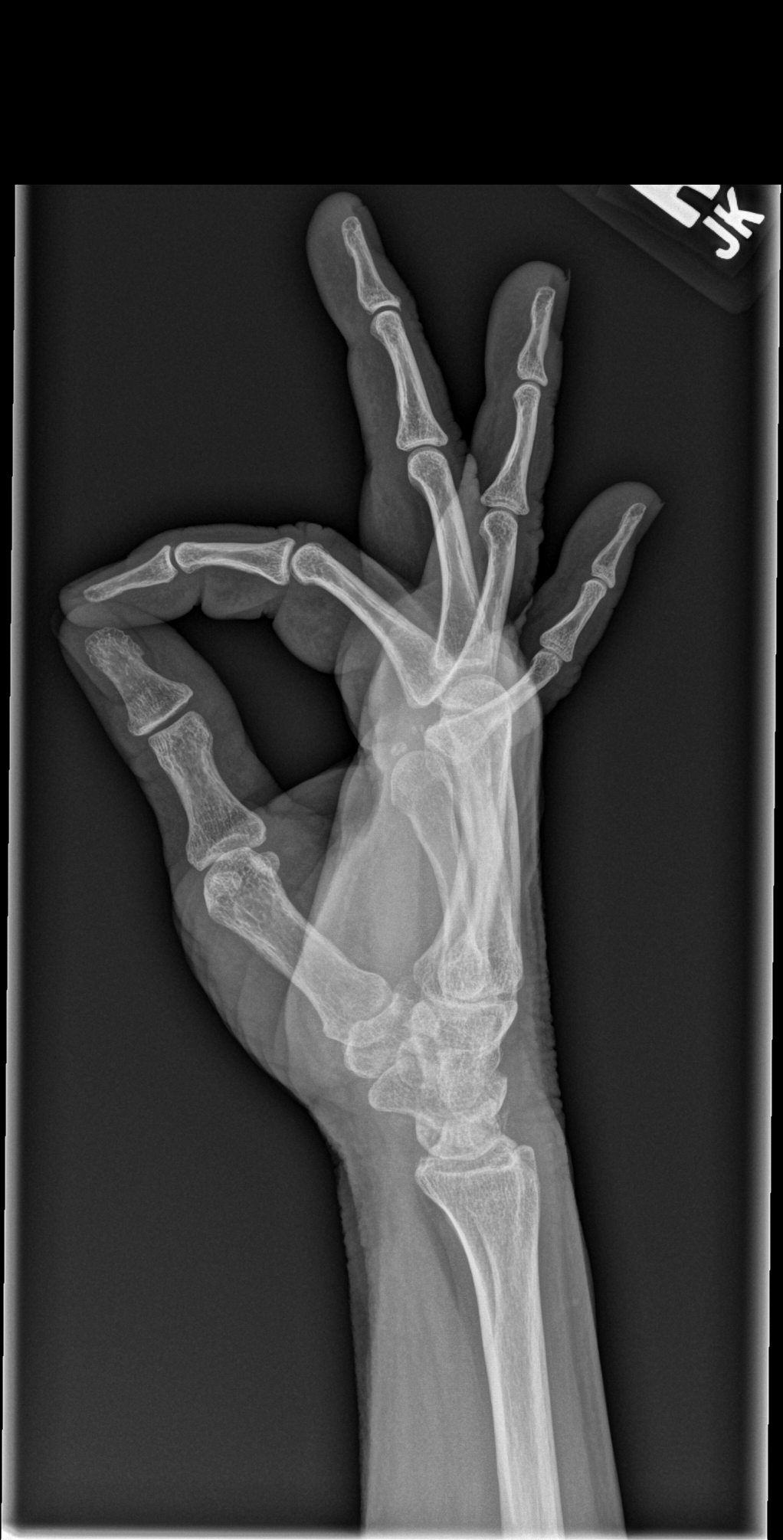

[3 of 3 positions shown; findings below may reference images not displayed]

FINDINGS: No acute fracture or dislocation is noted. Some mild soft tissue
calcifications are noted adjacent to the second MCP joint but to a
lesser degree than that seen on the left. Again some underlying gout
could not be totally excluded. Calcification is noted in the select
tissues of the ulnar carpal joint. No other focal abnormality is
seen.
IMPRESSION: Mild degenerative change without acute abnormality.

## 2022-03-07 DIAGNOSIS — R0981 Nasal congestion: Secondary | ICD-10-CM | POA: Diagnosis not present

## 2022-03-07 DIAGNOSIS — R519 Headache, unspecified: Secondary | ICD-10-CM | POA: Diagnosis not present

## 2022-03-07 DIAGNOSIS — M26609 Unspecified temporomandibular joint disorder, unspecified side: Secondary | ICD-10-CM | POA: Diagnosis not present

## 2022-03-07 DIAGNOSIS — H9201 Otalgia, right ear: Secondary | ICD-10-CM | POA: Diagnosis not present

## 2022-04-09 DIAGNOSIS — Z6823 Body mass index (BMI) 23.0-23.9, adult: Secondary | ICD-10-CM | POA: Diagnosis not present

## 2022-04-09 DIAGNOSIS — K529 Noninfective gastroenteritis and colitis, unspecified: Secondary | ICD-10-CM | POA: Diagnosis not present

## 2022-04-09 DIAGNOSIS — R112 Nausea with vomiting, unspecified: Secondary | ICD-10-CM | POA: Diagnosis not present

## 2022-04-10 ENCOUNTER — Other Ambulatory Visit: Payer: Self-pay

## 2022-04-10 ENCOUNTER — Emergency Department (HOSPITAL_COMMUNITY)
Admission: EM | Admit: 2022-04-10 | Discharge: 2022-04-10 | Disposition: A | Discharge status: Home / Self Care | Payer: BC Managed Care – PPO | Attending: Emergency Medicine | Admitting: Emergency Medicine

## 2022-04-10 ENCOUNTER — Emergency Department (HOSPITAL_COMMUNITY): Payer: BC Managed Care – PPO

## 2022-04-10 ENCOUNTER — Encounter (HOSPITAL_COMMUNITY): Payer: Self-pay

## 2022-04-10 DIAGNOSIS — R109 Unspecified abdominal pain: Secondary | ICD-10-CM | POA: Insufficient documentation

## 2022-04-10 DIAGNOSIS — R6883 Chills (without fever): Secondary | ICD-10-CM | POA: Insufficient documentation

## 2022-04-10 DIAGNOSIS — R42 Dizziness and giddiness: Secondary | ICD-10-CM | POA: Insufficient documentation

## 2022-04-10 DIAGNOSIS — Z79899 Other long term (current) drug therapy: Secondary | ICD-10-CM | POA: Diagnosis not present

## 2022-04-10 DIAGNOSIS — R112 Nausea with vomiting, unspecified: Secondary | ICD-10-CM | POA: Insufficient documentation

## 2022-04-10 DIAGNOSIS — I10 Essential (primary) hypertension: Secondary | ICD-10-CM | POA: Insufficient documentation

## 2022-04-10 DIAGNOSIS — N281 Cyst of kidney, acquired: Secondary | ICD-10-CM | POA: Diagnosis not present

## 2022-04-10 DIAGNOSIS — R251 Tremor, unspecified: Secondary | ICD-10-CM | POA: Insufficient documentation

## 2022-04-10 LAB — CBC WITH DIFFERENTIAL/PLATELET
Abs Immature Granulocytes: 0.07 10*3/uL (ref 0.00–0.07)
Basophils Absolute: 0 10*3/uL (ref 0.0–0.1)
Basophils Relative: 0 %
Eosinophils Absolute: 0 10*3/uL (ref 0.0–0.5)
Eosinophils Relative: 0 %
HCT: 44.4 % (ref 36.0–46.0)
Hemoglobin: 15.8 g/dL — ABNORMAL HIGH (ref 12.0–15.0)
Immature Granulocytes: 1 %
Lymphocytes Relative: 13 %
Lymphs Abs: 1.7 10*3/uL (ref 0.7–4.0)
MCH: 32.8 pg (ref 26.0–34.0)
MCHC: 35.6 g/dL (ref 30.0–36.0)
MCV: 92.3 fL (ref 80.0–100.0)
Monocytes Absolute: 1.4 10*3/uL — ABNORMAL HIGH (ref 0.1–1.0)
Monocytes Relative: 10 %
Neutro Abs: 10.2 10*3/uL — ABNORMAL HIGH (ref 1.7–7.7)
Neutrophils Relative %: 76 %
Platelets: 207 10*3/uL (ref 150–400)
RBC: 4.81 MIL/uL (ref 3.87–5.11)
RDW: 12.3 % (ref 11.5–15.5)
WBC: 13.4 10*3/uL — ABNORMAL HIGH (ref 4.0–10.5)
nRBC: 0 % (ref 0.0–0.2)

## 2022-04-10 LAB — COMPREHENSIVE METABOLIC PANEL
ALT: 18 U/L (ref 0–44)
AST: 31 U/L (ref 15–41)
Albumin: 4.1 g/dL (ref 3.5–5.0)
Alkaline Phosphatase: 70 U/L (ref 38–126)
Anion gap: 10 (ref 5–15)
BUN: 15 mg/dL (ref 8–23)
CO2: 24 mmol/L (ref 22–32)
Calcium: 9 mg/dL (ref 8.9–10.3)
Chloride: 95 mmol/L — ABNORMAL LOW (ref 98–111)
Creatinine, Ser: 0.78 mg/dL (ref 0.44–1.00)
GFR, Estimated: >60 mL/min (ref >60)
Glucose, Bld: 105 mg/dL — ABNORMAL HIGH (ref 70–99)
Potassium: 3.3 mmol/L — ABNORMAL LOW (ref 3.5–5.1)
Sodium: 129 mmol/L — ABNORMAL LOW (ref 135–145)
Total Bilirubin: 1.2 mg/dL (ref 0.3–1.2)
Total Protein: 7.1 g/dL (ref 6.5–8.1)

## 2022-04-10 LAB — CBG MONITORING, ED: Glucose-Capillary: 100 mg/dL — ABNORMAL HIGH (ref 70–99)

## 2022-04-10 LAB — LIPASE, BLOOD: Lipase: 48 U/L (ref 11–51)

## 2022-04-10 MED ORDER — SODIUM CHLORIDE 0.9 % IV BOLUS
1000.0000 mL | Freq: Once | INTRAVENOUS | Status: AC
  Administered 2022-04-10: 1000 mL via INTRAVENOUS

## 2022-04-10 MED ORDER — IOHEXOL 300 MG/ML  SOLN
75.0000 mL | Freq: Once | INTRAMUSCULAR | Status: AC | PRN
  Administered 2022-04-10: 75 mL via INTRAVENOUS

## 2022-04-10 MED ORDER — POTASSIUM CHLORIDE 10 MEQ/100ML IV SOLN
10.0000 meq | Freq: Once | INTRAVENOUS | Status: AC
  Administered 2022-04-10: 10 meq via INTRAVENOUS
  Filled 2022-04-10: qty 100

## 2022-04-10 MED ORDER — ONDANSETRON HCL 4 MG PO TABS: ORAL_TABLET | ORAL | 0 refills | Status: AC

## 2022-04-10 MED ORDER — ONDANSETRON HCL 4 MG/2ML IJ SOLN
4.0000 mg | Freq: Once | INTRAMUSCULAR | Status: AC
  Administered 2022-04-10: 4 mg via INTRAVENOUS
  Filled 2022-04-10: qty 2

## 2022-04-10 NOTE — Discharge Instructions (Addendum)
You were seen in the emergency department today for nausea and vomiting.  Your labs and imaging are normal.  You may just have a prolonged course of inflammation to your bowels from food poisoning.  Please continue to drink plenty of electrolyte rich fluids as well as a bland diet.  I am giving you some more Zofran which you can take 1 to 2 tablets every 6 hours for nausea.  Please return if you are unable to tolerate fluids or you begin worsening abdominal pain with fever.

## 2022-04-10 NOTE — ED Triage Notes (Signed)
Pt had bad chicken. Dx with salmonella at [facility] in [facility] yesterday. Given cipro and zofran. Continue vomiting and weakness.

## 2022-04-10 NOTE — ED Provider Notes (Signed)
[facility] EMERGENCY DEPARTMENT Provider Note   CSN: [id number] Arrival date & time: 04/10/22  1154     History  Chief Complaint  Patient presents with   Emesis    Joyce Hoffman is a 60 y.o. female.  With past medical history of hypertension, hyperlipidemia, alcoholism, hemochromatosis who presents to the emergency department with nausea and vomiting.  Patient states that she has had vomiting since last Wednesday.  She states at that time that she and her family had eaten bad chicken.  She states that other people had also become ill from the meal.  She describes having ongoing vomiting since then as well as abdominal cramping pain.  She states that she was seen at urgent care yesterday and given a prescription for ciprofloxacin.  She has taken 2 pills of this.  She states that this morning she was able to drink some Gatorade and then began to have vomiting again.  She has had shaking with hot and cold chills as well as being lightheaded.  She denies any fevers, dysuria, vaginal discharge.  She denies diarrhea, hematemesis or hematochezia or melena.  Denies syncope or shortness of breath.  HPI     Home Medications Prior to Admission medications   Medication Sig Start Date End Date Taking? Authorizing Provider  bisoprolol-hydrochlorothiazide (ZIAC) 5-6.25 MG tablet Take 1 tablet by mouth daily.    [provider]  ciprofloxacin (CIPRO) 500 MG tablet Take 1 tablet (500 mg total) by mouth 2 (two) times daily. 03/23/17   Houston Siren, MD  FLUoxetine (PROZAC) 40 MG capsule Take 40 mg by mouth daily.    [provider]  potassium chloride SA (K-DUR,KLOR-CON) 20 MEQ tablet Take 2 tablets (40 mEq total) by mouth daily. 03/23/17   Houston Siren, MD  pravastatin (PRAVACHOL) 20 MG tablet Take 20 mg by mouth daily.    [provider]      Allergies    Mobic [meloxicam]    Review of Systems   Review of Systems  Constitutional:  Positive for appetite change and fatigue.  Negative for fever.  Respiratory:  Negative for shortness of breath.   Cardiovascular:  Negative for chest pain.  Gastrointestinal:  Positive for abdominal pain, nausea and vomiting.  Genitourinary:  Negative for dysuria.  Neurological:  Positive for light-headedness. Negative for syncope.  All other systems reviewed and are negative.   Physical Exam Updated Vital Signs BP (!) 164/110   Pulse 79   Temp 98 F (36.7 C) (Oral)   Resp 18   Ht 5' 4.5" (1.638 m)   Wt 61.2 kg   SpO2 100%   BMI 22.81 kg/m  Physical Exam Vitals and nursing note reviewed.  Constitutional:      Appearance: She is ill-appearing.  HENT:     Head: Normocephalic and atraumatic.     Nose: Nose normal.     Mouth/Throat:     Mouth: Mucous membranes are dry.     Pharynx: Oropharynx is clear.  Eyes:     General: No scleral icterus.    Extraocular Movements: Extraocular movements intact.     Pupils: Pupils are equal, round, and reactive to light.  Cardiovascular:     Rate and Rhythm: Normal rate and regular rhythm.     Pulses: Normal pulses.     Heart sounds: No murmur heard. Pulmonary:     Effort: Pulmonary effort is normal. No respiratory distress.     Breath sounds: Normal breath sounds.  Abdominal:  General: Abdomen is protuberant. Bowel sounds are increased. There is no distension.     Palpations: Abdomen is soft.     Tenderness: There is generalized abdominal tenderness. There is no guarding.  Musculoskeletal:        General: Normal range of motion.     Cervical back: Neck supple.  Skin:    General: Skin is warm and dry.     Capillary Refill: Capillary refill takes less than 2 seconds.     Coloration: Skin is not jaundiced.  Neurological:     General: No focal deficit present.     Mental Status: She is alert and oriented to person, place, and time. Mental status is at baseline.  Psychiatric:        Mood and Affect: Mood normal.        Behavior: Behavior normal.        Thought Content:  Thought content normal.        Judgment: Judgment normal.    ED Results / Procedures / Treatments   Labs (all labs ordered are listed, but only abnormal results are displayed) Labs Reviewed  COMPREHENSIVE METABOLIC PANEL - Abnormal; Notable for the following components:      Result Value   Sodium 129 (*)    Potassium 3.3 (*)    Chloride 95 (*)    Glucose, Bld 105 (*)    All other components within normal limits  CBC WITH DIFFERENTIAL/PLATELET - Abnormal; Notable for the following components:   WBC 13.4 (*)    Hemoglobin 15.8 (*)    Neutro Abs 10.2 (*)    Monocytes Absolute 1.4 (*)    All other components within normal limits  CBG MONITORING, ED - Abnormal; Notable for the following components:   Glucose-Capillary 100 (*)    All other components within normal limits  LIPASE, BLOOD  URINALYSIS, ROUTINE W REFLEX MICROSCOPIC    EKG None  Radiology CT Abdomen Pelvis W Contrast  Result Date: 04/10/2022 CLINICAL DATA:  Abdominal pain with nausea and vomiting EXAM: CT ABDOMEN AND PELVIS WITH CONTRAST TECHNIQUE: Multidetector CT imaging of the abdomen and pelvis was performed using the standard protocol following bolus administration of intravenous contrast. RADIATION DOSE REDUCTION: This exam was performed according to the departmental dose-optimization program which includes automated exposure control, adjustment of the mA and/or kV according to patient size and/or use of iterative reconstruction technique. CONTRAST:  3mL OMNIPAQUE IOHEXOL 300 MG/ML  SOLN COMPARISON:  03/21/2017 FINDINGS: Lower chest: No focal pulmonary opacity or pleural effusion. No pericardial effusion. Hepatobiliary: The liver is normal in contour. Focal fat along the falciform ligament. No intra or extrahepatic biliary ductal dilatation. The gallbladder is unremarkable. The hepatic and portal veins are patent. Pancreas: Unremarkable. No pancreatic ductal dilatation or surrounding inflammatory changes. Spleen: Normal  in size without focal abnormality. Adrenals/Urinary Tract: The kidneys enhance symmetrically with no hydronephrosis. Multiple hypoenhancing lesions, the largest of which is compatible with a renal cyst. Smaller lesions are too small to characterize. The bladder is unremarkable. Stomach/Bowel: Stomach is within normal limits. Prior appendectomy. No evidence of bowel wall thickening, distention, or inflammatory changes. Diverticulosis without evidence diverticulitis. Vascular/Lymphatic: Aortic atherosclerosis. No enlarged abdominal or pelvic lymph nodes. Reproductive: Uterus and bilateral adnexa are unremarkable. Other: No abdominal wall hernia or abnormality. No abdominopelvic ascites. Musculoskeletal: No acute or significant osseous findings. IMPRESSION: No acute abnormality in the abdomen or pelvis. Electronically Signed   By: Wiliam Ke M.D.   On: 04/10/2022 15:03    Procedures  Procedures   Medications Ordered in ED Medications  sodium chloride 0.9 % bolus 1,000 mL (0 mLs Intravenous Stopped 04/10/22 1517)  ondansetron (ZOFRAN) injection 4 mg (4 mg Intravenous Given 04/10/22 1313)  potassium chloride 10 mEq in 100 mL IVPB (0 mEq Intravenous Stopped 04/10/22 1517)  iohexol (OMNIPAQUE) 300 MG/ML solution 75 mL (75 mLs Intravenous Contrast Given 04/10/22 1437)    ED Course/ Medical Decision Making/ A&P                           Medical Decision Making Amount and/or Complexity of Data Reviewed Labs: ordered. Radiology: ordered.  Risk Prescription drug management.  This patient presents to the ED for concern of nausea and vomiting, this involves an extensive number of treatment options, and is a complaint that carries with it a high risk of complications and morbidity.  The differential diagnosis includes bowel obstruction, gastroenteritis, pancreatitis, acute hepatobiliary disease, UTI, pyelo-, ectopic, torsion, diverticulitis, cannabis hyperemesis, etc.  Co morbidities that complicate the  patient evaluation Hypertension tension, alcohol abuse  Additional history obtained:  Additional history obtained from: Husband at bedside External records from outside source obtained and reviewed including: Urgent care physician note from yesterday  Cardiac Monitoring: The patient was maintained on a cardiac monitor.  I personally viewed and interpreted the cardiac monitored which showed an underlying rhythm of: Sinus rhythm  Lab Results: I personally ordered, reviewed, and interpreted labs. Pertinent results include: CBC with leukocytosis to 13.4 CMP with sodium of 129, potassium of 3.3, chloride 95, consistent with dehydration and ongoing nausea and vomiting Lipase 48  Imaging Studies ordered:  I ordered imaging studies which included CT.  I independently reviewed & interpreted imaging & am in agreement with radiology impression. Imaging shows: CT abdomen pelvis is negative  Medications  I ordered medication including Zofran, IV fluids, potassium for rehydration and nausea Reevaluation of the patient after medication shows that patient improved -I reviewed the patient's home medications and did not make adjustments. -I did  prescribe new home medications.  Tests Considered: N/A  Critical Interventions: Fluid resuscitation  Consultations: N/A  SDH None identified  ED Course:  61 year old female who presents to the emergency department with nausea and vomiting for almost 1 week at this time.  On physical exam she is ill-appearing but not toxic in appearance.  She appears mildly dehydrated.  She has had minimal p.o. intake over the past few days due to her nausea and vomiting. Abdomen is soft, diffusely, mildly tender but no peritoneal signs. Obtain labs which show that she does have a white count of 13.4, appears dehydrated based on her CMP. Given IV fluids as well as Zofran and potassium for replacement. Obtain CT abdomen pelvis.  She did get diagnosed with  gastroenteritis yesterday urgent care.  This is mostly consistent with the timeframe of how long she has had nausea and vomiting.  With her white count and ongoing nausea vomiting feel is appropriate to get imaging.  This yielded no acute findings.  On repeat evaluation of the patient she is feeling improved after having fluids and Zofran.  She is no longer having nausea.  I p.o. trialed her and she did not have any recurrent abdominal pain, nausea or vomiting. Lipase is 48, negative, doubt pancreatitis, no transaminitis, elevated bilirubin concerning for acute hepatobiliary disease and nothing seen on CT. Labs and consistent with DKA. No urinary symptoms, doubt UTI or pyelonephritis.  Again imaging does not show  any stranding or edema around the kidneys. There is no evidence of diverticulitis, appendicitis, cholecystitis, gastroenteritis on her imaging.  This may be ongoing, prolonged gastroenteritis from possible bad chicken last week.  She was already prescribed p.o. Cipro in urgent care.  She can continue this.  Additionally I will prescribe her Zofran.  She can use 4 to 8 mg every 6 hours as needed.  Given return precautions for worsening symptoms with fever or inability to tolerate p.o.  She verbalizes understanding is in agreement with the plan.  After consideration of the diagnostic results and the patients response to treatment, I feel that the patent would benefit from discharge. The patient has been appropriately medically screened and/or stabilized in the ED. I have low suspicion for any other emergent medical condition which would require further screening, evaluation or treatment in the ED or require inpatient management. The patient is overall well appearing and non-toxic in appearance. They are hemodynamically stable at time of discharge.   Final Clinical Impression(s) / ED Diagnoses Final diagnoses:  Nausea and vomiting, unspecified vomiting type    Rx / DC Orders ED Discharge  Orders          Ordered    ondansetron (ZOFRAN) 4 MG tablet        04/10/22 1625              Cristopher Peru, PA-C 04/10/22 1740    Bethann Berkshire, MD 04/12/22 1719

## 2022-05-17 DIAGNOSIS — R0981 Nasal congestion: Secondary | ICD-10-CM | POA: Diagnosis not present

## 2022-05-17 DIAGNOSIS — R55 Syncope and collapse: Secondary | ICD-10-CM | POA: Diagnosis not present

## 2022-05-17 DIAGNOSIS — R519 Headache, unspecified: Secondary | ICD-10-CM | POA: Diagnosis not present

## 2022-05-17 DIAGNOSIS — H93299 Other abnormal auditory perceptions, unspecified ear: Secondary | ICD-10-CM | POA: Diagnosis not present

## 2022-05-25 DIAGNOSIS — H903 Sensorineural hearing loss, bilateral: Secondary | ICD-10-CM | POA: Diagnosis not present

## 2022-05-30 DIAGNOSIS — R55 Syncope and collapse: Secondary | ICD-10-CM | POA: Diagnosis not present

## 2022-05-30 DIAGNOSIS — R519 Headache, unspecified: Secondary | ICD-10-CM | POA: Diagnosis not present

## 2022-05-30 DIAGNOSIS — J3489 Other specified disorders of nose and nasal sinuses: Secondary | ICD-10-CM | POA: Diagnosis not present

## 2022-05-30 DIAGNOSIS — J342 Deviated nasal septum: Secondary | ICD-10-CM | POA: Diagnosis not present

## 2022-05-30 DIAGNOSIS — J323 Chronic sphenoidal sinusitis: Secondary | ICD-10-CM | POA: Diagnosis not present

## 2022-06-06 DIAGNOSIS — N951 Menopausal and female climacteric states: Secondary | ICD-10-CM | POA: Diagnosis not present

## 2022-06-28 DIAGNOSIS — E785 Hyperlipidemia, unspecified: Secondary | ICD-10-CM | POA: Diagnosis not present

## 2022-06-28 DIAGNOSIS — Z79899 Other long term (current) drug therapy: Secondary | ICD-10-CM | POA: Diagnosis not present

## 2023-01-04 DIAGNOSIS — Z0000 Encounter for general adult medical examination without abnormal findings: Secondary | ICD-10-CM | POA: Diagnosis not present

## 2023-01-04 DIAGNOSIS — Z13220 Encounter for screening for lipoid disorders: Secondary | ICD-10-CM | POA: Diagnosis not present

## 2023-01-10 DIAGNOSIS — R42 Dizziness and giddiness: Secondary | ICD-10-CM | POA: Diagnosis not present

## 2023-01-10 DIAGNOSIS — M542 Cervicalgia: Secondary | ICD-10-CM | POA: Diagnosis not present

## 2023-01-17 DIAGNOSIS — R42 Dizziness and giddiness: Secondary | ICD-10-CM | POA: Diagnosis not present

## 2023-01-17 DIAGNOSIS — M542 Cervicalgia: Secondary | ICD-10-CM | POA: Diagnosis not present

## 2023-02-18 DIAGNOSIS — R600 Localized edema: Secondary | ICD-10-CM | POA: Diagnosis not present

## 2023-02-18 DIAGNOSIS — R0602 Shortness of breath: Secondary | ICD-10-CM | POA: Diagnosis not present

## 2023-02-18 DIAGNOSIS — I10 Essential (primary) hypertension: Secondary | ICD-10-CM | POA: Diagnosis not present

## 2023-03-15 DIAGNOSIS — G4700 Insomnia, unspecified: Secondary | ICD-10-CM | POA: Diagnosis not present

## 2023-03-15 DIAGNOSIS — I10 Essential (primary) hypertension: Secondary | ICD-10-CM | POA: Diagnosis not present

## 2023-04-12 DIAGNOSIS — G629 Polyneuropathy, unspecified: Secondary | ICD-10-CM | POA: Diagnosis not present

## 2023-04-12 DIAGNOSIS — I10 Essential (primary) hypertension: Secondary | ICD-10-CM | POA: Diagnosis not present

## 2023-05-10 DIAGNOSIS — I10 Essential (primary) hypertension: Secondary | ICD-10-CM | POA: Diagnosis not present

## 2023-05-10 DIAGNOSIS — J40 Bronchitis, not specified as acute or chronic: Secondary | ICD-10-CM | POA: Diagnosis not present

## 2023-08-02 ENCOUNTER — Emergency Department (HOSPITAL_COMMUNITY): Payer: BC Managed Care – PPO

## 2023-08-02 ENCOUNTER — Emergency Department (HOSPITAL_COMMUNITY)
Admission: EM | Admit: 2023-08-02 | Discharge: 2023-08-03 | Disposition: A | Discharge status: Home / Self Care | Payer: BC Managed Care – PPO | Attending: Emergency Medicine | Admitting: Emergency Medicine

## 2023-08-02 ENCOUNTER — Other Ambulatory Visit: Payer: Self-pay

## 2023-08-02 ENCOUNTER — Encounter (HOSPITAL_COMMUNITY): Payer: Self-pay

## 2023-08-02 DIAGNOSIS — E871 Hypo-osmolality and hyponatremia: Secondary | ICD-10-CM | POA: Insufficient documentation

## 2023-08-02 DIAGNOSIS — R112 Nausea with vomiting, unspecified: Secondary | ICD-10-CM

## 2023-08-02 DIAGNOSIS — E860 Dehydration: Secondary | ICD-10-CM | POA: Diagnosis not present

## 2023-08-02 DIAGNOSIS — A419 Sepsis, unspecified organism: Secondary | ICD-10-CM | POA: Diagnosis not present

## 2023-08-02 DIAGNOSIS — E876 Hypokalemia: Secondary | ICD-10-CM

## 2023-08-02 DIAGNOSIS — R1111 Vomiting without nausea: Secondary | ICD-10-CM | POA: Diagnosis not present

## 2023-08-02 DIAGNOSIS — R110 Nausea: Secondary | ICD-10-CM | POA: Diagnosis not present

## 2023-08-02 DIAGNOSIS — D72829 Elevated white blood cell count, unspecified: Secondary | ICD-10-CM | POA: Diagnosis not present

## 2023-08-02 DIAGNOSIS — Z0389 Encounter for observation for other suspected diseases and conditions ruled out: Secondary | ICD-10-CM | POA: Diagnosis not present

## 2023-08-02 DIAGNOSIS — N281 Cyst of kidney, acquired: Secondary | ICD-10-CM | POA: Diagnosis not present

## 2023-08-02 DIAGNOSIS — R531 Weakness: Secondary | ICD-10-CM | POA: Diagnosis not present

## 2023-08-02 DIAGNOSIS — R000 Tachycardia, unspecified: Secondary | ICD-10-CM | POA: Diagnosis not present

## 2023-08-02 LAB — COMPREHENSIVE METABOLIC PANEL
ALT: 12 U/L (ref 0–44)
AST: 16 U/L (ref 15–41)
Albumin: 4 g/dL (ref 3.5–5.0)
Alkaline Phosphatase: 68 U/L (ref 38–126)
Anion gap: 12 (ref 5–15)
BUN: 48 mg/dL — ABNORMAL HIGH (ref 8–23)
CO2: 23 mmol/L (ref 22–32)
Calcium: 8.9 mg/dL (ref 8.9–10.3)
Chloride: 98 mmol/L (ref 98–111)
Creatinine, Ser: 0.89 mg/dL (ref 0.44–1.00)
GFR, Estimated: >60 mL/min (ref >60)
Glucose, Bld: 108 mg/dL — ABNORMAL HIGH (ref 70–99)
Potassium: 2.9 mmol/L — ABNORMAL LOW (ref 3.5–5.1)
Sodium: 133 mmol/L — ABNORMAL LOW (ref 135–145)
Total Bilirubin: 0.5 mg/dL (ref <1.2)
Total Protein: 6.8 g/dL (ref 6.5–8.1)

## 2023-08-02 LAB — URINALYSIS, ROUTINE W REFLEX MICROSCOPIC
Bilirubin Urine: NEGATIVE
Glucose, UA: NEGATIVE mg/dL
Ketones, ur: 20 mg/dL — AB
Leukocytes,Ua: NEGATIVE
Nitrite: NEGATIVE
Protein, ur: 100 mg/dL — AB
Specific Gravity, Urine: 1.024 (ref 1.005–1.030)
pH: 6 (ref 5.0–8.0)

## 2023-08-02 LAB — CBC WITH DIFFERENTIAL/PLATELET
Abs Immature Granulocytes: 0.05 10*3/uL (ref 0.00–0.07)
Basophils Absolute: 0 10*3/uL (ref 0.0–0.1)
Basophils Relative: 0 %
Eosinophils Absolute: 0 10*3/uL (ref 0.0–0.5)
Eosinophils Relative: 0 %
HCT: 45.7 % (ref 36.0–46.0)
Hemoglobin: 16.3 g/dL — ABNORMAL HIGH (ref 12.0–15.0)
Immature Granulocytes: 0 %
Lymphocytes Relative: 14 %
Lymphs Abs: 2 10*3/uL (ref 0.7–4.0)
MCH: 32.8 pg (ref 26.0–34.0)
MCHC: 35.7 g/dL (ref 30.0–36.0)
MCV: 92 fL (ref 80.0–100.0)
Monocytes Absolute: 1.7 10*3/uL — ABNORMAL HIGH (ref 0.1–1.0)
Monocytes Relative: 12 %
Neutro Abs: 10.8 10*3/uL — ABNORMAL HIGH (ref 1.7–7.7)
Neutrophils Relative %: 74 %
Platelets: 256 10*3/uL (ref 150–400)
RBC: 4.97 MIL/uL (ref 3.87–5.11)
RDW: 13.4 % (ref 11.5–15.5)
WBC: 14.6 10*3/uL — ABNORMAL HIGH (ref 4.0–10.5)
nRBC: 0 % (ref 0.0–0.2)

## 2023-08-02 LAB — LIPASE, BLOOD: Lipase: 32 U/L (ref 11–51)

## 2023-08-02 LAB — LACTIC ACID, PLASMA: Lactic Acid, Venous: 0.8 mmol/L (ref 0.5–1.9)

## 2023-08-02 LAB — MAGNESIUM: Magnesium: 2.4 mg/dL (ref 1.7–2.4)

## 2023-08-02 LAB — APTT: aPTT: 28 s (ref 24–36)

## 2023-08-02 LAB — PROTIME-INR
INR: 1.1 (ref 0.8–1.2)
Prothrombin Time: 14.2 s (ref 11.4–15.2)

## 2023-08-02 MED ORDER — LACTATED RINGERS IV BOLUS (SEPSIS)
500.0000 mL | Freq: Once | INTRAVENOUS | Status: AC
  Administered 2023-08-02: 500 mL via INTRAVENOUS

## 2023-08-02 MED ORDER — POTASSIUM CHLORIDE 10 MEQ/100ML IV SOLN
10.0000 meq | INTRAVENOUS | Status: DC
Start: 2023-08-02 — End: 2023-08-03
  Administered 2023-08-02: 10 meq via INTRAVENOUS
  Filled 2023-08-02: qty 100

## 2023-08-02 MED ORDER — SODIUM CHLORIDE 0.9 % IV SOLN
2.0000 g | INTRAVENOUS | Status: DC
Start: 2023-08-02 — End: 2023-08-09
  Administered 2023-08-02: 2 g via INTRAVENOUS
  Filled 2023-08-02: qty 20

## 2023-08-02 MED ORDER — LACTATED RINGERS IV BOLUS
1000.0000 mL | Freq: Once | INTRAVENOUS | Status: AC
  Administered 2023-08-02: 1000 mL via INTRAVENOUS

## 2023-08-02 MED ORDER — ONDANSETRON HCL 4 MG/2ML IJ SOLN
4.0000 mg | Freq: Once | INTRAMUSCULAR | Status: AC
  Administered 2023-08-02: 4 mg via INTRAVENOUS
  Filled 2023-08-02: qty 2

## 2023-08-02 MED ORDER — IOHEXOL 300 MG/ML  SOLN
80.0000 mL | Freq: Once | INTRAMUSCULAR | Status: AC | PRN
  Administered 2023-08-02: 80 mL via INTRAVENOUS

## 2023-08-02 MED ORDER — POTASSIUM CHLORIDE CRYS ER 20 MEQ PO TBCR
40.0000 meq | EXTENDED_RELEASE_TABLET | Freq: Once | ORAL | Status: AC
  Administered 2023-08-02: 40 meq via ORAL
  Filled 2023-08-02: qty 2

## 2023-08-02 MED ORDER — ONDANSETRON 4 MG PO TBDP: 4.0000 mg | ORAL_TABLET | Freq: Four times a day (QID) | ORAL | 0 refills | Status: AC | PRN

## 2023-08-02 MED ORDER — LACTATED RINGERS IV SOLN: INTRAVENOUS | Status: DC

## 2023-08-02 NOTE — ED Notes (Signed)
Pt was able to successful drink fluid without choking or vomiting both during and after.

## 2023-08-02 NOTE — Sepsis Progress Note (Signed)
Elink monitoring for the code sepsis protocol.  

## 2023-08-02 NOTE — ED Notes (Signed)
Pt has full mobility & is ambulatory at home.  Clean from urine soaked clothes. Pt provided slip resistant socks but refused to put them on.

## 2023-08-02 NOTE — Discharge Instructions (Addendum)
Your evaluated today for generalized weakness nausea and vomiting.  You are also having urinary frequency.  Do not have a UTI.  Your blood work showed low potassium and dehydration but otherwise was reassuring.  You were given potassium and IV fluids.  Make sure you are drinking plenty at home, eat foods high in potassium such as potatoes, bananas, oranges or orange juice.  Follow-up with your primary doctor on Monday, come back to the ER if you have any new or worsening symptoms

## 2023-08-02 NOTE — ED Triage Notes (Addendum)
EMS: weak for a few days, can't control bladder, N/V, and possible UTI. 141/89, 98% on RA, 102 HR, and 97.8 temp.  Pt: weak, nauseous, and vomiting for 3 days. 3 episodes of vomiting today. Stated mother made her come. Hx of UTI's.stated she has been urinating more than usual. Stated there is a burning sensation. These symptoms have happen before and it was a UTI.  Pt lying in bed with no apparent distress. Able to answer questions and follow commands. Denies pain at this time. Full mobility with all extremities.

## 2023-08-02 NOTE — ED Provider Notes (Signed)
[place] EMERGENCY DEPARTMENT AT [facility] Provider Note   CSN: [id number] Arrival date & time: 08/02/23  1947     History  Chief Complaint  Patient presents with   Weakness    Joyce Hoffman is a 62 y.o. female.  She has history of hemochromatosis, depression, presents the ER today for evaluation of "I am pissing all over myself" with nausea and vomiting, started 3 days ago.  No fever, having some lower abdominal tenderness.  She is reporting generalized weakness and feeling unsteady when she walks denies any injuries, denies spatially head injury.  No chest pain or shortness of breath.  No flank pain.  She reports has been having urinary frequency today, cannot to make it to the bathroom in time.  Denies any loss of perineal sensation, states symptoms like this in the past have been due to UTI.  She denies any recent drug or alcohol abuse.  The patient's husband is at bedside and notes she has not eaten much since Monday, did drink Gatorade today however    Weakness      Home Medications Prior to Admission medications   Medication Sig Start Date End Date Taking? Authorizing Provider  bisoprolol-hydrochlorothiazide (ZIAC) 5-6.25 MG tablet Take 1 tablet by mouth daily.    [provider]  ciprofloxacin (CIPRO) 500 MG tablet Take 1 tablet (500 mg total) by mouth 2 (two) times daily. 03/23/17   Houston Siren, MD  FLUoxetine (PROZAC) 40 MG capsule Take 40 mg by mouth daily.    [provider]  ondansetron [facility]) 4 MG tablet May take 1-2 tablets by mouth every 6 hours for nausea and vomiting 04/10/22   Cristopher Peru, PA-C  potassium chloride SA (K-DUR,KLOR-CON) 20 MEQ tablet Take 2 tablets (40 mEq total) by mouth daily. 03/23/17   Houston Siren, MD  pravastatin (PRAVACHOL) 20 MG tablet Take 20 mg by mouth daily.    [provider]      Allergies    Mobic [meloxicam]    Review of Systems   Review of Systems  Neurological:  Positive for weakness.     Physical Exam Updated Vital Signs BP (!) 167/89   Pulse 91   Temp 98.2 F (36.8 C) (Oral)   Resp 18   Ht 5\' 4"  (1.626 m)   Wt 58.1 kg   SpO2 99%   BMI 21.97 kg/m  Physical Exam Vitals and nursing note reviewed.  Constitutional:      General: She is not in acute distress.    Appearance: She is well-developed.  HENT:     Head: Normocephalic and atraumatic.     Mouth/Throat:     Mouth: Mucous membranes are moist.  Eyes:     Conjunctiva/sclera: Conjunctivae normal.  Cardiovascular:     Rate and Rhythm: Normal rate and regular rhythm.     Heart sounds: No murmur heard. Pulmonary:     Effort: Pulmonary effort is normal. No respiratory distress.     Breath sounds: Normal breath sounds.  Abdominal:     Palpations: Abdomen is soft.     Tenderness: There is no abdominal tenderness. There is no right CVA tenderness, left CVA tenderness, guarding or rebound.  Musculoskeletal:        General: No swelling.     Cervical back: Neck supple.  Skin:    General: Skin is warm and dry.     Capillary Refill: Capillary refill takes less than 2 seconds.  Neurological:     General:  No focal deficit present.     Mental Status: She is alert and oriented to person, place, and time.  Psychiatric:        Mood and Affect: Mood normal.     ED Results / Procedures / Treatments   Labs (all labs ordered are listed, but only abnormal results are displayed) Labs Reviewed  CBC WITH DIFFERENTIAL/PLATELET - Abnormal; Notable for the following components:      Result Value   WBC 14.6 (*)    Hemoglobin 16.3 (*)    Neutro Abs 10.8 (*)    Monocytes Absolute 1.7 (*)    All other components within normal limits  COMPREHENSIVE METABOLIC PANEL - Abnormal; Notable for the following components:   Sodium 133 (*)    Potassium 2.9 (*)    Glucose, Bld 108 (*)    BUN 48 (*)    All other components within normal limits  URINALYSIS, ROUTINE W REFLEX MICROSCOPIC - Abnormal; Notable for the following  components:   APPearance HAZY (*)    Hgb urine dipstick SMALL (*)    Ketones, ur 20 (*)    Protein, ur 100 (*)    Bacteria, UA RARE (*)    All other components within normal limits  CULTURE, BLOOD (ROUTINE X 2)  CULTURE, BLOOD (ROUTINE X 2)  LIPASE, BLOOD  LACTIC ACID, PLASMA  MAGNESIUM  PROTIME-INR  APTT    EKG EKG Interpretation Date/Time:  Friday August 02 2023 21:54:13 EST Ventricular Rate:  88 PR Interval:  117 QRS Duration:  90 QT Interval:  426 QTC Calculation: 516 R Axis:   68  Text Interpretation: Sinus rhythm Borderline short PR interval Borderline repolarization abnormality Prolonged QT interval Confirmed by Eber Hong ([id number]) on 08/02/2023 9:58:32 PM  Radiology CT ABDOMEN PELVIS W CONTRAST  Result Date: 08/02/2023 CLINICAL DATA:  Sepsis, nausea, vomiting. EXAM: CT ABDOMEN AND PELVIS WITH CONTRAST TECHNIQUE: Multidetector CT imaging of the abdomen and pelvis was performed using the standard protocol following bolus administration of intravenous contrast. RADIATION DOSE REDUCTION: This exam was performed according to the departmental dose-optimization program which includes automated exposure control, adjustment of the mA and/or kV according to patient size and/or use of iterative reconstruction technique. CONTRAST:  80mL OMNIPAQUE IOHEXOL 300 MG/ML  SOLN COMPARISON:  04/10/2022 FINDINGS: Lower chest: No acute abnormality Hepatobiliary: Low-density adjacent to the falciform ligament, likely focal fatty infiltration. No focal suspicious hepatic abnormality. Gallbladder unremarkable. Pancreas: No focal abnormality or ductal dilatation. Spleen: No focal abnormality.  Normal size. Adrenals/Urinary Tract: Bilateral renal cysts are stable since prior study and compatible with simple cysts. No follow-up imaging recommended. No stones or hydronephrosis. Adrenal glands and urinary bladder unremarkable. Stomach/Bowel: Stomach, large and small bowel grossly unremarkable.  Vascular/Lymphatic: Aortic atherosclerosis. No evidence of aneurysm or adenopathy. Reproductive: Uterus and adnexa unremarkable.  No mass. Other: No free fluid or free air. Musculoskeletal: No acute bony abnormality. IMPRESSION: No acute findings in the abdomen or pelvis. Aortic atherosclerosis. Electronically Signed   By: Charlett Nose M.D.   On: 08/02/2023 22:40   DG Chest Port 1 View  Result Date: 08/02/2023 CLINICAL DATA:  Concern for sepsis. EXAM: PORTABLE CHEST 1 VIEW COMPARISON:  Chest radiograph dated 03/21/2017. FINDINGS: No focal consolidation, pleural effusion, or pneumothorax. The cardiac silhouette is within normal limits. No acute osseous pathology. IMPRESSION: No active disease. Electronically Signed   By: Elgie Collard M.D.   On: 08/02/2023 22:28    Procedures Procedures    Medications Ordered in ED Medications  lactated ringers infusion (has no administration in time range)  cefTRIAXone (ROCEPHIN) 2 g in sodium chloride 0.9 % 100 mL IVPB (2 g Intravenous New Bag/Given 08/02/23 2234)  lactated ringers bolus 500 mL (500 mLs Intravenous New Bag/Given 08/02/23 2307)  potassium chloride 10 mEq in 100 mL IVPB (10 mEq Intravenous New Bag/Given 08/02/23 2308)  ondansetron (ZOFRAN) injection 4 mg (4 mg Intravenous Given 08/02/23 2059)  lactated ringers bolus 1,000 mL (0 mLs Intravenous Stopped 08/02/23 2216)  iohexol (OMNIPAQUE) 300 MG/ML solution 80 mL (80 mLs Intravenous Contrast Given 08/02/23 2215)  potassium chloride SA (KLOR-CON M) CR tablet 40 mEq (40 mEq Oral Given 08/02/23 2302)    ED Course/ Medical Decision Making/ A&P Clinical Course as of 08/02/23 2328  Fri Aug 02, 2023  2257 CBC with Differential(!) [CB]    Clinical Course User Index [CB] Ma Rings, PA-C                                 Medical Decision Making This patient presents to the ED for concern of urinary frequency and incontinence, this involves an extensive number of treatment options, and  is a complaint that carries with it a high risk of complications and morbidity.  The differential diagnosis includes UTI, sepsis, pyelonephritis, electrolyte derangement, other   Co morbidities that complicate the patient evaluation :   Hemochromatosis   Additional history obtained:  Additional history obtained from EMR External records from outside source obtained and reviewed including prior notes   Lab Tests:  I Ordered, and personally interpreted labs.  The pertinent results include: CBC shows leukocytosis of 14.6, hemoglobin 16.3, CMP shows mild hyponatremia, hypokalemia with potassium of 2.9, normal mag, BUN elevated at 48 with normal creatinine.  UA is negative   Imaging Studies ordered:  I ordered imaging studies including chest x-ray which shows no pulmonary edema or infiltrate, CT abdomen pelvis shows no acute findings I independently visualized and interpreted imaging within scope of identifying emergent findings  I agree with the radiologist interpretation   Cardiac Monitoring: / EKG:  The patient was maintained on a cardiac monitor.  I personally viewed and interpreted the cardiac monitored which showed an underlying rhythm of: Sinus rhythm    Problem List / ED Course / Critical interventions / Medication management  Dehydration-patient having nausea vomiting for the past several days, having urinary incontinence and frequency today after drinking Gatorade.  Patient initially tachycardic and had leukocytosis meeting SIRS criteria having urinary symptoms so was empirically given Rocephin and CT abdomen pelvis ordered to rule out other source of infection.  CT and chest x-ray unremarkable, labs show elevated BUN, hypokalemia consistent with her dehydration, she is being hydrated with IV fluids, given oral and IV potassium.  Was given Zofran for her nausea, she is feeling much better at this time.  She is no longer tachycardic.  She is tolerating p.o., plan to ambulate and  if she can ambulate successfully will discharge home for close outpatient follow-up.  I have reviewed the patients home medicines and have made adjustments as needed  Signed out to Dr. Judd Lien pending ambulation trial     Amount and/or Complexity of Data Reviewed Labs: ordered. Decision-making details documented in ED Course. Radiology: ordered. ECG/medicine tests: ordered.  Risk Prescription drug management.           Final Clinical Impression(s) / ED Diagnoses Final diagnoses:  None    Rx /  DC Orders ED Discharge Orders     None         Josem Kaufmann 08/02/23 2328    Eber Hong, MD 08/03/23 [phone]

## 2023-08-02 NOTE — ED Notes (Signed)
Pt placed on bedpan- unable to urinate

## 2023-08-07 LAB — CULTURE, BLOOD (ROUTINE X 2)
Culture: NO GROWTH
Culture: NO GROWTH
Special Requests: ADEQUATE

## 2023-12-10 ENCOUNTER — Emergency Department (HOSPITAL_COMMUNITY)
Admission: EM | Admit: 2023-12-10 | Discharge: 2023-12-10 | Disposition: A | Discharge status: Home / Self Care | Attending: Emergency Medicine | Admitting: Emergency Medicine

## 2023-12-10 ENCOUNTER — Encounter (HOSPITAL_COMMUNITY): Payer: Self-pay | Admitting: Emergency Medicine

## 2023-12-10 ENCOUNTER — Emergency Department (HOSPITAL_COMMUNITY)

## 2023-12-10 ENCOUNTER — Other Ambulatory Visit: Payer: Self-pay

## 2023-12-10 DIAGNOSIS — W01198A Fall on same level from slipping, tripping and stumbling with subsequent striking against other object, initial encounter: Secondary | ICD-10-CM | POA: Insufficient documentation

## 2023-12-10 DIAGNOSIS — R42 Dizziness and giddiness: Secondary | ICD-10-CM | POA: Diagnosis not present

## 2023-12-10 DIAGNOSIS — R410 Disorientation, unspecified: Secondary | ICD-10-CM | POA: Diagnosis not present

## 2023-12-10 DIAGNOSIS — S0093XA Contusion of unspecified part of head, initial encounter: Secondary | ICD-10-CM

## 2023-12-10 DIAGNOSIS — R001 Bradycardia, unspecified: Secondary | ICD-10-CM | POA: Diagnosis not present

## 2023-12-10 DIAGNOSIS — S0083XA Contusion of other part of head, initial encounter: Secondary | ICD-10-CM | POA: Diagnosis not present

## 2023-12-10 DIAGNOSIS — I6782 Cerebral ischemia: Secondary | ICD-10-CM | POA: Diagnosis not present

## 2023-12-10 DIAGNOSIS — I959 Hypotension, unspecified: Secondary | ICD-10-CM | POA: Diagnosis not present

## 2023-12-10 DIAGNOSIS — W19XXXA Unspecified fall, initial encounter: Secondary | ICD-10-CM

## 2023-12-10 DIAGNOSIS — I10 Essential (primary) hypertension: Secondary | ICD-10-CM | POA: Diagnosis not present

## 2023-12-10 DIAGNOSIS — S0990XA Unspecified injury of head, initial encounter: Secondary | ICD-10-CM | POA: Diagnosis not present

## 2023-12-10 LAB — URINALYSIS, ROUTINE W REFLEX MICROSCOPIC
Bilirubin Urine: NEGATIVE
Glucose, UA: NEGATIVE mg/dL
Hgb urine dipstick: NEGATIVE
Ketones, ur: NEGATIVE mg/dL
Leukocytes,Ua: NEGATIVE
Nitrite: NEGATIVE
Protein, ur: NEGATIVE mg/dL
Specific Gravity, Urine: 1.013 (ref 1.005–1.030)
pH: 5 (ref 5.0–8.0)

## 2023-12-10 LAB — CBC WITH DIFFERENTIAL/PLATELET
Abs Immature Granulocytes: 0.04 10*3/uL (ref 0.00–0.07)
Basophils Absolute: 0 10*3/uL (ref 0.0–0.1)
Basophils Relative: 0 %
Eosinophils Absolute: 0.1 10*3/uL (ref 0.0–0.5)
Eosinophils Relative: 1 %
HCT: 33.1 % — ABNORMAL LOW (ref 36.0–46.0)
Hemoglobin: 11.1 g/dL — ABNORMAL LOW (ref 12.0–15.0)
Immature Granulocytes: 0 %
Lymphocytes Relative: 12 %
Lymphs Abs: 1.4 10*3/uL (ref 0.7–4.0)
MCH: 31.2 pg (ref 26.0–34.0)
MCHC: 33.5 g/dL (ref 30.0–36.0)
MCV: 93 fL (ref 80.0–100.0)
Monocytes Absolute: 0.7 10*3/uL (ref 0.1–1.0)
Monocytes Relative: 6 %
Neutro Abs: 10 10*3/uL — ABNORMAL HIGH (ref 1.7–7.7)
Neutrophils Relative %: 81 %
Platelets: 183 10*3/uL (ref 150–400)
RBC: 3.56 MIL/uL — ABNORMAL LOW (ref 3.87–5.11)
RDW: 14.4 % (ref 11.5–15.5)
WBC: 12.3 10*3/uL — ABNORMAL HIGH (ref 4.0–10.5)
nRBC: 0 % (ref 0.0–0.2)

## 2023-12-10 LAB — COMPREHENSIVE METABOLIC PANEL
ALT: 9 U/L (ref 0–44)
AST: 12 U/L — ABNORMAL LOW (ref 15–41)
Albumin: 3.5 g/dL (ref 3.5–5.0)
Alkaline Phosphatase: 43 U/L (ref 38–126)
Anion gap: 9 (ref 5–15)
BUN: 16 mg/dL (ref 8–23)
CO2: 23 mmol/L (ref 22–32)
Calcium: 8.4 mg/dL — ABNORMAL LOW (ref 8.9–10.3)
Chloride: 104 mmol/L (ref 98–111)
Creatinine, Ser: 0.66 mg/dL (ref 0.44–1.00)
GFR, Estimated: >60 mL/min (ref >60)
Glucose, Bld: 109 mg/dL — ABNORMAL HIGH (ref 70–99)
Potassium: 3.5 mmol/L (ref 3.5–5.1)
Sodium: 136 mmol/L (ref 135–145)
Total Bilirubin: 0.2 mg/dL (ref 0.0–1.2)
Total Protein: 6 g/dL — ABNORMAL LOW (ref 6.5–8.1)

## 2023-12-10 MED ORDER — SODIUM CHLORIDE 0.9 % IV BOLUS
1000.0000 mL | Freq: Once | INTRAVENOUS | Status: AC
  Administered 2023-12-10: 1000 mL via INTRAVENOUS

## 2023-12-10 NOTE — ED Triage Notes (Signed)
 Pt tripped on nail on front porch striking head but denies any loc. Pt has been having dizziness when standing for a couple of weeks. Ems states pt was hypotensive on their arrival.

## 2023-12-10 NOTE — ED Provider Notes (Signed)
 [place] EMERGENCY DEPARTMENT AT [facility] Provider Note   CSN: [id number] Arrival date & time: 12/10/23  1818     History  Chief Complaint  Patient presents with   Marletta Lor    Joyce Hoffman is a 63 y.o. female.  Patient reports she tripped and fell striking her head on the porch.  Patient reports that there was a nail sticking up that caused her to fall.  Patient reports she hit her head.  She did not lose consciousness.  Patient is here with family member who states that he helped her up but that she had difficulty getting her feet back under her.  Patient denies any current areas of pain.  She reports her vision and her hearing seems normal she is able to move all extremities.  She denies any neck or back pain.  Patient's family member who is with her states that patient has been experiencing increasing confusion for the past year.  He is concerned that patient is developing dementia.  He states that she is frequently confused.  The history is provided by the patient. No language interpreter was used.  Fall This is a new problem. The problem occurs constantly.       Home Medications Prior to Admission medications   Medication Sig Start Date End Date Taking? Authorizing Provider  bisoprolol-hydrochlorothiazide (ZIAC) 5-6.25 MG tablet Take 1 tablet by mouth daily.    [provider]  ciprofloxacin (CIPRO) 500 MG tablet Take 1 tablet (500 mg total) by mouth 2 (two) times daily. 03/23/17   Houston Siren, MD  FLUoxetine (PROZAC) 40 MG capsule Take 40 mg by mouth daily.    [provider]  ondansetron [facility]) 4 MG tablet May take 1-2 tablets by mouth every 6 hours for nausea and vomiting 04/10/22   Cristopher Peru, PA-C  ondansetron (ZOFRAN-ODT) 4 MG disintegrating tablet Take 1 tablet (4 mg total) by mouth every 6 (six) hours as needed for nausea or vomiting. 08/02/23   Carmel Sacramento A, PA-C  potassium chloride SA (K-DUR,KLOR-CON) 20 MEQ tablet Take 2 tablets  (40 mEq total) by mouth daily. 03/23/17   Houston Siren, MD  pravastatin (PRAVACHOL) 20 MG tablet Take 20 mg by mouth daily.    [provider]      Allergies    Mobic [meloxicam]    Review of Systems   Review of Systems  All other systems reviewed and are negative.   Physical Exam Updated Vital Signs BP (!) 140/102 (BP Location: Right Arm)   Pulse 65   Temp 97.8 F (36.6 C) (Oral)   Resp 13   Ht 5\' 4"  (1.626 m)   Wt 58 kg   SpO2 100%   BMI 21.95 kg/m  Physical Exam Vitals and nursing note reviewed.  Constitutional:      Appearance: She is well-developed.  HENT:     Head: Normocephalic and atraumatic.     Comments: No bruising no swelling    Nose: Nose normal.     Mouth/Throat:     Mouth: Mucous membranes are moist.  Eyes:     Extraocular Movements: Extraocular movements intact.     Conjunctiva/sclera: Conjunctivae normal.     Pupils: Pupils are equal, round, and reactive to light.  Pulmonary:     Effort: Pulmonary effort is normal.  Abdominal:     General: There is no distension.  Musculoskeletal:        General: Normal range of motion.     Cervical  back: Normal range of motion.  Neurological:     Mental Status: She is alert.     Comments: Confused, oriented to self and place.  Psychiatric:        Mood and Affect: Mood normal.     ED Results / Procedures / Treatments   Labs (all labs ordered are listed, but only abnormal results are displayed) Labs Reviewed  CBC WITH DIFFERENTIAL/PLATELET - Abnormal; Notable for the following components:      Result Value   WBC 12.3 (*)    RBC 3.56 (*)    Hemoglobin 11.1 (*)    HCT 33.1 (*)    Neutro Abs 10.0 (*)    All other components within normal limits  COMPREHENSIVE METABOLIC PANEL - Abnormal; Notable for the following components:   Glucose, Bld 109 (*)    Calcium 8.4 (*)    Total Protein 6.0 (*)    AST 12 (*)    All other components within normal limits  URINALYSIS, ROUTINE W REFLEX MICROSCOPIC     EKG None  Radiology CT Head Wo Contrast Result Date: 12/10/2023 CLINICAL DATA:  Head trauma EXAM: CT HEAD WITHOUT CONTRAST TECHNIQUE: Contiguous axial images were obtained from the base of the skull through the vertex without intravenous contrast. RADIATION DOSE REDUCTION: This exam was performed according to the departmental dose-optimization program which includes automated exposure control, adjustment of the mA and/or kV according to patient size and/or use of iterative reconstruction technique. COMPARISON:  None Available. FINDINGS: Brain: No acute territorial infarction, hemorrhage or intracranial mass. Atrophy. Moderate chronic small vessel ischemic changes of the white matter. Nonenlarged ventricles Vascular: No hyperdense vessel or unexpected calcification. Skull: Normal. Negative for fracture or focal lesion. Sinuses/Orbits: No acute finding. Other: None IMPRESSION: 1. No CT evidence for acute intracranial abnormality. 2. Atrophy and chronic small vessel ischemic changes of the white matter. Electronically Signed   By: Jasmine Pang M.D.   On: 12/10/2023 20:05    Procedures Procedures    Medications Ordered in ED Medications  sodium chloride 0.9 % bolus 1,000 mL (1,000 mLs Intravenous New Bag/Given 12/10/23 1937)    ED Course/ Medical Decision Making/ A&P                                 Medical Decision Making Patient complains of falling and hitting her head today  Amount and/or Complexity of Data Reviewed Independent Historian: spouse    Details: Patient is here with her spouse who reports patient has been having increasing confusion recently.  He states he feels like patient has dementia. Labs: ordered. Decision-making details documented in ED Course.    Details: Labs ordered reviewed and interpreted.  Hemoglobin is 11.1 Radiology: ordered and independent interpretation performed. Decision-making details documented in ED Course.    Details: CT head shows no acute head  injury.  CT scan does show atrophy and chronic vascular disease ECG/medicine tests: ordered and independent interpretation performed. Decision-making details documented in ED Course.    Details: EKG sinus rhythm no acute changes  Risk Risk Details: I discussed CT findings with patient and spouse.  I discussed symptoms of dementia.  I have advised that patient needs to be seen by neurology for evaluation.  Patient's confusion and neurologic changes are not acute.  Her spouse reports that this has been going on for an extended period of time.  He states that patient is safe in her home she  does not drive but she does not work.  He states he is with her most of the time.  He has no concerns about patient's care at home at this point.  Neurology referral placed           Final Clinical Impression(s) / ED Diagnoses Final diagnoses:  Fall, initial encounter  Contusion of head, unspecified part of head, initial encounter  Confusion    Rx / DC Orders ED Discharge Orders     None      An After Visit Summary was printed and given to the patient.    Osie Cheeks 12/10/23 2216    Bethann Berkshire, MD 12/12/23 [phone]

## 2023-12-10 NOTE — ED Notes (Signed)
 Patient transported to CT

## 2024-01-06 DIAGNOSIS — E785 Hyperlipidemia, unspecified: Secondary | ICD-10-CM | POA: Diagnosis not present

## 2024-01-06 DIAGNOSIS — I10 Essential (primary) hypertension: Secondary | ICD-10-CM | POA: Diagnosis not present

## 2024-01-06 DIAGNOSIS — Z8639 Personal history of other endocrine, nutritional and metabolic disease: Secondary | ICD-10-CM | POA: Diagnosis not present

## 2024-01-06 DIAGNOSIS — M62838 Other muscle spasm: Secondary | ICD-10-CM | POA: Diagnosis not present

## 2024-01-06 DIAGNOSIS — G4700 Insomnia, unspecified: Secondary | ICD-10-CM | POA: Diagnosis not present

## 2024-01-09 ENCOUNTER — Other Ambulatory Visit (HOSPITAL_COMMUNITY): Payer: Self-pay | Admitting: Family Medicine

## 2024-01-09 DIAGNOSIS — R748 Abnormal levels of other serum enzymes: Secondary | ICD-10-CM

## 2024-01-14 ENCOUNTER — Ambulatory Visit
Admission: RE | Admit: 2024-01-14 | Discharge: 2024-01-14 | Disposition: A | Discharge status: Home / Self Care | Source: Ambulatory Visit | Attending: Family Medicine | Admitting: Family Medicine

## 2024-01-14 DIAGNOSIS — R748 Abnormal levels of other serum enzymes: Secondary | ICD-10-CM

## 2024-01-14 DIAGNOSIS — R945 Abnormal results of liver function studies: Secondary | ICD-10-CM | POA: Diagnosis not present

## 2024-01-15 DIAGNOSIS — Z01419 Encounter for gynecological examination (general) (routine) without abnormal findings: Secondary | ICD-10-CM | POA: Diagnosis not present

## 2024-01-15 DIAGNOSIS — Z1151 Encounter for screening for human papillomavirus (HPV): Secondary | ICD-10-CM | POA: Diagnosis not present

## 2024-01-15 DIAGNOSIS — N8189 Other female genital prolapse: Secondary | ICD-10-CM | POA: Diagnosis not present

## 2024-01-15 DIAGNOSIS — R32 Unspecified urinary incontinence: Secondary | ICD-10-CM | POA: Diagnosis not present

## 2024-01-15 DIAGNOSIS — Z124 Encounter for screening for malignant neoplasm of cervix: Secondary | ICD-10-CM | POA: Diagnosis not present

## 2024-01-31 DIAGNOSIS — R32 Unspecified urinary incontinence: Secondary | ICD-10-CM | POA: Diagnosis not present

## 2024-01-31 DIAGNOSIS — I10 Essential (primary) hypertension: Secondary | ICD-10-CM | POA: Diagnosis not present

## 2024-01-31 DIAGNOSIS — G4700 Insomnia, unspecified: Secondary | ICD-10-CM | POA: Diagnosis not present

## 2024-01-31 DIAGNOSIS — Z720 Tobacco use: Secondary | ICD-10-CM | POA: Diagnosis not present

## 2024-01-31 DIAGNOSIS — N39492 Postural (urinary) incontinence: Secondary | ICD-10-CM | POA: Diagnosis not present

## 2024-02-04 DIAGNOSIS — R87810 Cervical high risk human papillomavirus (HPV) DNA test positive: Secondary | ICD-10-CM | POA: Diagnosis not present

## 2024-02-04 DIAGNOSIS — R87610 Atypical squamous cells of undetermined significance on cytologic smear of cervix (ASC-US): Secondary | ICD-10-CM | POA: Diagnosis not present

## 2024-02-12 DIAGNOSIS — R945 Abnormal results of liver function studies: Secondary | ICD-10-CM | POA: Diagnosis not present

## 2024-03-04 DIAGNOSIS — K5900 Constipation, unspecified: Secondary | ICD-10-CM | POA: Diagnosis not present

## 2024-03-04 DIAGNOSIS — M6281 Muscle weakness (generalized): Secondary | ICD-10-CM | POA: Diagnosis not present

## 2024-03-04 DIAGNOSIS — N393 Stress incontinence (female) (male): Secondary | ICD-10-CM | POA: Diagnosis not present

## 2024-03-04 DIAGNOSIS — R102 Pelvic and perineal pain: Secondary | ICD-10-CM | POA: Diagnosis not present

## 2024-03-04 DIAGNOSIS — R32 Unspecified urinary incontinence: Secondary | ICD-10-CM | POA: Diagnosis not present

## 2024-03-12 DIAGNOSIS — M62838 Other muscle spasm: Secondary | ICD-10-CM | POA: Diagnosis not present

## 2024-03-12 DIAGNOSIS — N393 Stress incontinence (female) (male): Secondary | ICD-10-CM | POA: Diagnosis not present

## 2024-03-12 DIAGNOSIS — K5900 Constipation, unspecified: Secondary | ICD-10-CM | POA: Diagnosis not present

## 2024-03-12 DIAGNOSIS — R102 Pelvic and perineal pain: Secondary | ICD-10-CM | POA: Diagnosis not present

## 2024-03-12 DIAGNOSIS — M6281 Muscle weakness (generalized): Secondary | ICD-10-CM | POA: Diagnosis not present

## 2024-03-12 DIAGNOSIS — R32 Unspecified urinary incontinence: Secondary | ICD-10-CM | POA: Diagnosis not present

## 2024-03-31 ENCOUNTER — Ambulatory Visit: Admitting: Diagnostic Neuroimaging

## 2024-04-24 DIAGNOSIS — I10 Essential (primary) hypertension: Secondary | ICD-10-CM | POA: Diagnosis not present

## 2024-04-24 DIAGNOSIS — R945 Abnormal results of liver function studies: Secondary | ICD-10-CM | POA: Diagnosis not present

## 2024-04-30 ENCOUNTER — Other Ambulatory Visit: Payer: Self-pay | Admitting: Family Medicine

## 2024-04-30 DIAGNOSIS — R42 Dizziness and giddiness: Secondary | ICD-10-CM

## 2024-05-29 DIAGNOSIS — K7689 Other specified diseases of liver: Secondary | ICD-10-CM | POA: Diagnosis not present
